# Patient Record
Sex: Female | Born: 1955 | Race: White | Hispanic: No | Smoking: Former smoker
Health system: Southern US, Community
[De-identification: ages and names within clinical notes are randomized; demographics above are authoritative.]

---

## 2008-12-17 HISTORY — PX: COLONOSCOPY: SHX174

## 2008-12-17 LAB — HM COLONOSCOPY

## 2014-10-15 LAB — HM MAMMOGRAPHY: HM Mammogram: NORMAL

## 2014-12-17 HISTORY — PX: COLONOSCOPY: SHX174

## 2015-01-01 ENCOUNTER — Ambulatory Visit: Payer: Self-pay | Admitting: Family Medicine

## 2015-01-01 LAB — COMPREHENSIVE METABOLIC PANEL
AST: 14 U/L — AB (ref 15–37)
Albumin: 3.9 g/dL (ref 3.4–5.0)
Alkaline Phosphatase: 69 U/L
Anion Gap: 12 (ref 7–16)
BUN: 11 mg/dL (ref 7–18)
Bilirubin,Total: 0.5 mg/dL (ref 0.2–1.0)
CHLORIDE: 105 mmol/L (ref 98–107)
CREATININE: 0.68 mg/dL (ref 0.60–1.30)
Calcium, Total: 8.9 mg/dL (ref 8.5–10.1)
Co2: 26 mmol/L (ref 21–32)
EGFR (African American): >60
Glucose: 113 mg/dL — ABNORMAL HIGH (ref 65–99)
OSMOLALITY: 285 (ref 275–301)
POTASSIUM: 3.8 mmol/L (ref 3.5–5.1)
SGPT (ALT): 23 U/L
SODIUM: 143 mmol/L (ref 136–145)
Total Protein: 7 g/dL (ref 6.4–8.2)

## 2015-01-01 LAB — CBC WITH DIFFERENTIAL/PLATELET
Basophil #: 0 10*3/uL (ref 0.0–0.1)
Basophil %: 0.6 %
EOS PCT: 1.1 %
Eosinophil #: 0.1 10*3/uL (ref 0.0–0.7)
HCT: 39.1 % (ref 35.0–47.0)
HGB: 12.9 g/dL (ref 12.0–16.0)
Lymphocyte #: 1.4 10*3/uL (ref 1.0–3.6)
Lymphocyte %: 20.8 %
MCH: 30.7 pg (ref 26.0–34.0)
MCHC: 33 g/dL (ref 32.0–36.0)
MCV: 93 fL (ref 80–100)
MONOS PCT: 5.9 %
Monocyte #: 0.4 x10 3/mm (ref 0.2–0.9)
Neutrophil #: 4.9 10*3/uL (ref 1.4–6.5)
Neutrophil %: 71.6 %
Platelet: 201 10*3/uL (ref 150–440)
RBC: 4.21 10*6/uL (ref 3.80–5.20)
RDW: 12.9 % (ref 11.5–14.5)
WBC: 6.9 10*3/uL (ref 3.6–11.0)

## 2015-01-01 LAB — URINALYSIS, COMPLETE
Bacteria: NEGATIVE
Bilirubin,UR: NEGATIVE
Glucose,UR: NEGATIVE
KETONE: NEGATIVE
Leukocyte Esterase: NEGATIVE
Nitrite: NEGATIVE
PROTEIN: NEGATIVE
Ph: 6 (ref 5.0–8.0)
Specific Gravity: 1.01 (ref 1.000–1.030)

## 2015-01-03 LAB — URINE CULTURE

## 2015-06-24 ENCOUNTER — Ambulatory Visit (INDEPENDENT_AMBULATORY_CARE_PROVIDER_SITE_OTHER): Payer: 59 | Admitting: Internal Medicine

## 2015-06-24 ENCOUNTER — Encounter: Payer: Self-pay | Admitting: Internal Medicine

## 2015-06-24 ENCOUNTER — Other Ambulatory Visit: Payer: Self-pay

## 2015-06-24 VITALS — BP 100/56 | HR 68 | Ht 63.0 in | Wt 122.2 lb

## 2015-06-24 DIAGNOSIS — K5730 Diverticulosis of large intestine without perforation or abscess without bleeding: Secondary | ICD-10-CM

## 2015-06-24 DIAGNOSIS — M779 Enthesopathy, unspecified: Secondary | ICD-10-CM

## 2015-06-24 DIAGNOSIS — K219 Gastro-esophageal reflux disease without esophagitis: Secondary | ICD-10-CM | POA: Diagnosis not present

## 2015-06-24 DIAGNOSIS — R058 Other specified cough: Secondary | ICD-10-CM

## 2015-06-24 DIAGNOSIS — G43909 Migraine, unspecified, not intractable, without status migrainosus: Secondary | ICD-10-CM | POA: Insufficient documentation

## 2015-06-24 DIAGNOSIS — R05 Cough: Secondary | ICD-10-CM | POA: Insufficient documentation

## 2015-06-24 DIAGNOSIS — H6980 Other specified disorders of Eustachian tube, unspecified ear: Secondary | ICD-10-CM | POA: Insufficient documentation

## 2015-06-24 HISTORY — DX: Other specified cough: R05.8

## 2015-06-24 HISTORY — DX: Enthesopathy, unspecified: M77.9

## 2015-06-24 NOTE — Progress Notes (Signed)
Date:  06/24/2015   Name:  Lisa Blackburn   DOB:  04-24-1956   MRN:  [id number]   Chief Complaint: Diarrhea Diarrhea  This is a new (has some abd bloating last night the several forms stools.  Now feels better.) problem. The current episode started yesterday. Pertinent negatives include no abdominal pain, chills, coughing, fever or vomiting. Nothing aggravates the symptoms. There are no known risk factors.  Lisa Blackburn has a history of diverticulitis - last episode in January.  Lisa Blackburn mother was concerned and did not want Lisa Blackburn to get as sick as Lisa Blackburn was before.  Lisa Blackburn was scheduled for an EGD and Colonoscopy but had to cancel due to insurance authorization.  Lisa Blackburn will do this later this summer.   Review of Systems:  Review of Systems  Constitutional: Negative for fever, chills and unexpected weight change.  Respiratory: Negative for cough and shortness of breath.   Cardiovascular: Negative for chest pain.  Gastrointestinal: Positive for diarrhea. Negative for vomiting, abdominal pain and blood in stool.  Neurological: Negative for weakness.    Patient Active Problem List   Diagnosis Date Noted  . Headache, migraine 06/24/2015  . Acute calcific periarthritis 06/24/2015  . Allergic cough 06/24/2015  . Diverticulosis of sigmoid colon 06/24/2015  . Dysfunction of eustachian tube 06/24/2015  . Gastro-esophageal reflux disease without esophagitis 06/24/2015    Prior to Admission medications   Medication Sig Start Date End Date Taking? Authorizing Provider  estradiol (ESTRACE) 0.5 MG tablet Take 0.5 mg by mouth daily.   Yes Historical Provider, MD  progesterone (PROMETRIUM) 100 MG capsule Take by mouth.   Yes Historical Provider, MD    Allergies  Allergen Reactions  . Codeine Nausea Only    No past surgical history on file.  History  Substance Use Topics  . Smoking status: Never Smoker   . Smokeless tobacco: Not on file  . Alcohol Use: 0.0 oz/week    0 Standard drinks or equivalent per week      Medication list has been reviewed and updated.  Physical Examination:  Physical Exam  Constitutional: Lisa Blackburn is oriented to person, place, and time. Lisa Blackburn appears well-developed and well-nourished. No distress.  Neck: Neck supple. No thyromegaly present.  Cardiovascular: Normal rate, regular rhythm and normal heart sounds.   Pulmonary/Chest: Breath sounds normal. Lisa Blackburn has no wheezes.  Abdominal: Soft. Bowel sounds are normal. Lisa Blackburn exhibits no distension and no mass. There is no tenderness. There is no rebound and no guarding.  Lymphadenopathy:    Lisa Blackburn has no cervical adenopathy.  Neurological: Lisa Blackburn is alert and oriented to person, place, and time.  Skin: Skin is warm and dry.    BP 100/56 mmHg  Pulse 68  Ht 5\' 3"  (1.6 m)  Wt 122 lb 3.2 oz (55.43 kg)  BMI 21.65 kg/m2  Assessment and Plan: 1. Diverticulosis of sigmoid colon Proceed with EGD and Colon as planned No evidence of infection at this time  2. Gastro-esophageal reflux disease without esophagitis Improved - no longer using PPI regularly   Bari EdwardLaura Berglund, MD [facility]  06/24/2015

## 2015-07-11 ENCOUNTER — Ambulatory Visit (INDEPENDENT_AMBULATORY_CARE_PROVIDER_SITE_OTHER): Payer: 59 | Admitting: Internal Medicine

## 2015-07-11 ENCOUNTER — Encounter: Payer: Self-pay | Admitting: Internal Medicine

## 2015-07-11 VITALS — BP 96/56 | HR 60 | Ht 63.0 in | Wt 124.2 lb

## 2015-07-11 DIAGNOSIS — M659 Synovitis and tenosynovitis, unspecified: Secondary | ICD-10-CM | POA: Diagnosis not present

## 2015-07-11 DIAGNOSIS — M778 Other enthesopathies, not elsewhere classified: Secondary | ICD-10-CM

## 2015-07-11 MED ORDER — PREDNISONE 10 MG (21) PO TBPK: 10.0000 mg | ORAL_TABLET | Freq: Every day | ORAL | Status: DC

## 2015-07-11 NOTE — Progress Notes (Signed)
Date:  07/11/2015   Name:  Lisa Blackburn   DOB:  1956/12/01   MRN:  [id number]   Chief Complaint: Joint Swelling Arm Pain  The incident occurred 12 to 24 hours ago. The incident occurred at home. There was no injury mechanism. The pain is present in the right elbow. The quality of the pain is described as aching. The pain does not radiate. The pain is mild. The pain has been constant since the incident. Pertinent negatives include no muscle weakness, numbness or tingling. Nothing aggravates the symptoms. She has tried ice for the symptoms. The treatment provided mild relief.   Review of Systems:  Review of Systems  Constitutional: Negative for fever and chills.  Musculoskeletal: Positive for joint swelling and arthralgias.  Neurological: Negative for tingling, weakness and numbness.    Patient Active Problem List   Diagnosis Date Noted  . Headache, migraine 06/24/2015  . Acute calcific periarthritis 06/24/2015  . Allergic cough 06/24/2015  . Diverticulosis of sigmoid colon 06/24/2015  . Dysfunction of eustachian tube 06/24/2015  . Gastro-esophageal reflux disease without esophagitis 06/24/2015    Prior to Admission medications   Medication Sig Start Date End Date Taking? Authorizing Provider  estradiol (ESTRACE) 0.5 MG tablet Take 0.5 mg by mouth daily.   Yes Historical Provider, MD  progesterone (PROMETRIUM) 100 MG capsule Take by mouth.   Yes Historical Provider, MD    Allergies  Allergen Reactions  . Codeine Nausea Only    No past surgical history on file.  History  Substance Use Topics  . Smoking status: Never Smoker   . Smokeless tobacco: Not on file  . Alcohol Use: 0.0 oz/week    0 Standard drinks or equivalent per week     Medication list has been reviewed and updated.  Physical Examination:  Physical Exam  Constitutional: She is oriented to person, place, and time. She appears well-developed and well-nourished. No distress.  Cardiovascular: Normal  rate.   Pulses:      Radial pulses are 2+ on the right side, and 2+ on the left side.  Pulmonary/Chest: Effort normal and breath sounds normal.  Musculoskeletal:       Right elbow: She exhibits swelling. Tenderness found. Lateral epicondyle and olecranon process tenderness noted.       Arms: Neurological: She is alert and oriented to person, place, and time.  Nursing note and vitals reviewed.   BP 96/56 mmHg  Pulse 60  Ht  (1.6 m)  Wt 124 lb 3.2 oz (56.337 kg)  BMI 22.01 kg/m2  Assessment and Plan: 1. Tendonitis of elbow, right With some bursitis - improving with ice  Continue ice as needed - predniSONE (STERAPRED UNI-PAK 21 TAB) 10 MG (21) TBPK tablet; Take 1 tablet (10 mg total) by mouth daily.  Dispense: 21 tablet; Refill: 0  Bari Edward, MD [facility]  07/11/2015

## 2015-08-18 IMAGING — CT CT ABD-PELV W/ CM
2 of 5 series · 16 of 46 positions shown, 18 images · IV contrast (isovue)
Comparison: None.

CLINICAL DATA: One day history of right lower quadrant abdominal
pain.

EXAM:
CT ABDOMEN AND PELVIS WITH CONTRAST
TECHNIQUE: Multidetector CT imaging of the abdomen and pelvis was performed
using the standard protocol following bolus administration of
intravenous contrast.
CONTRAST:  100 ml Isovue 370 IV. Oral contrast was also
administered.

[Series 2: axial soft tissue · axial · 0.65mm/px · z∈[-826,-456]mm · 13 of 84 slices shown, 15 images]
[im 5/84  soft-tissue]
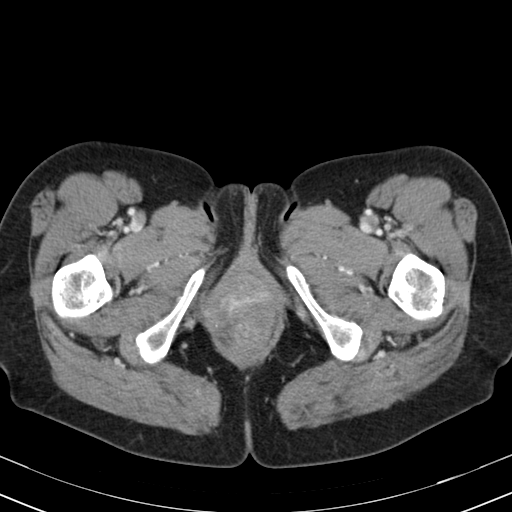
[im 5/84  bone]
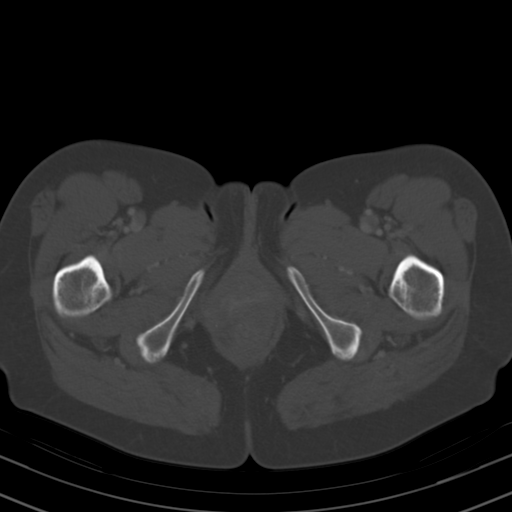
[im 14/84  soft-tissue]
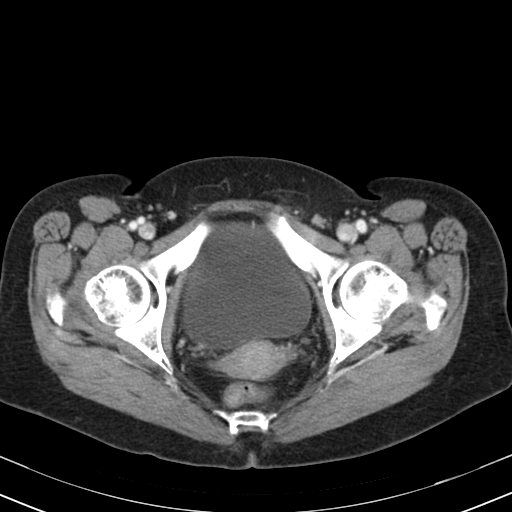
[im 18/84  soft-tissue]
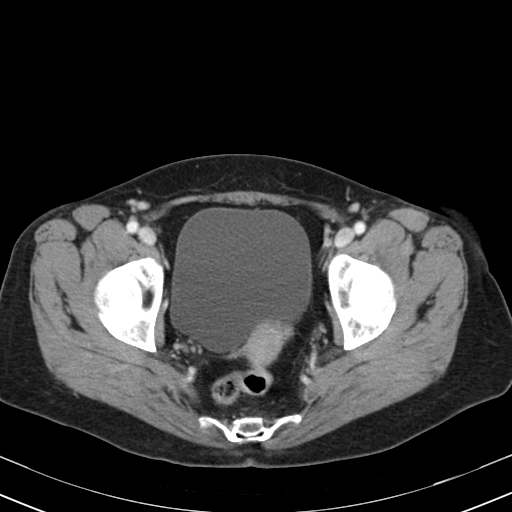
[im 22/84  soft-tissue]
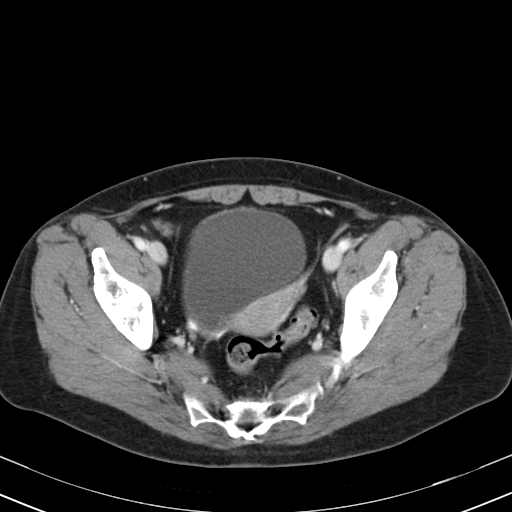
[im 31/84  soft-tissue]
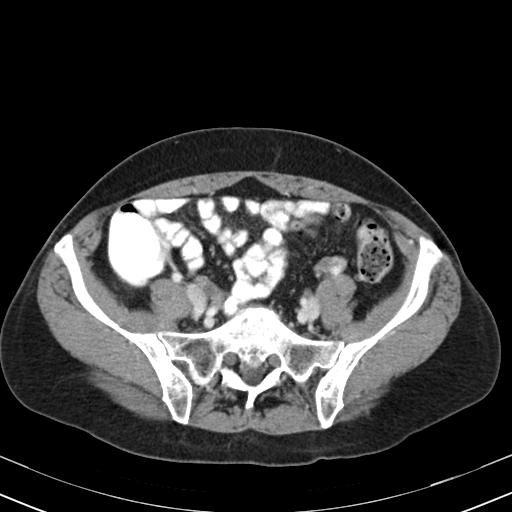
[im 35/84  soft-tissue]
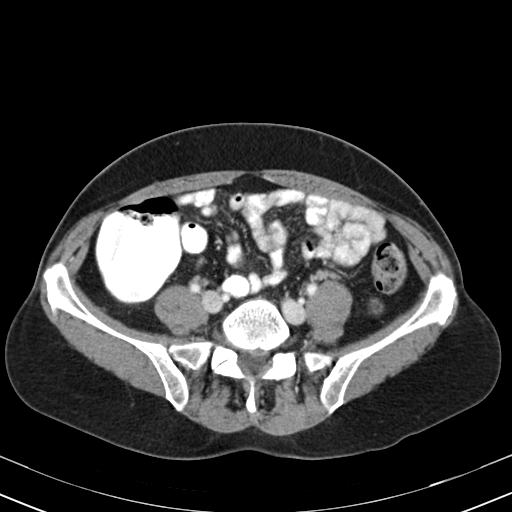
[im 44/84  soft-tissue]
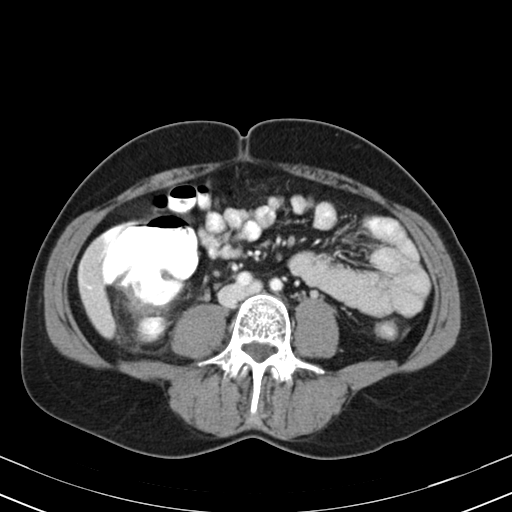
[im 49/84  soft-tissue]
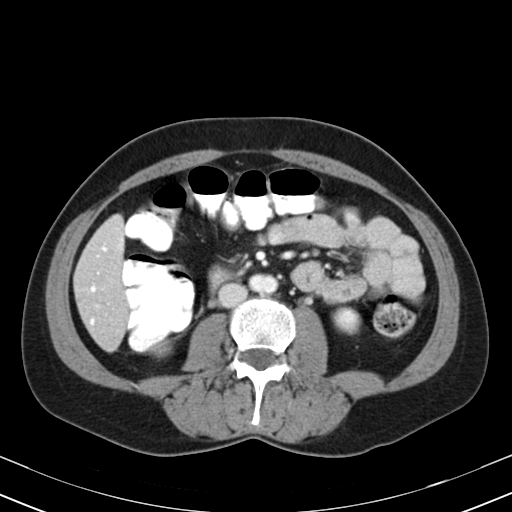
[im 53/84  soft-tissue]
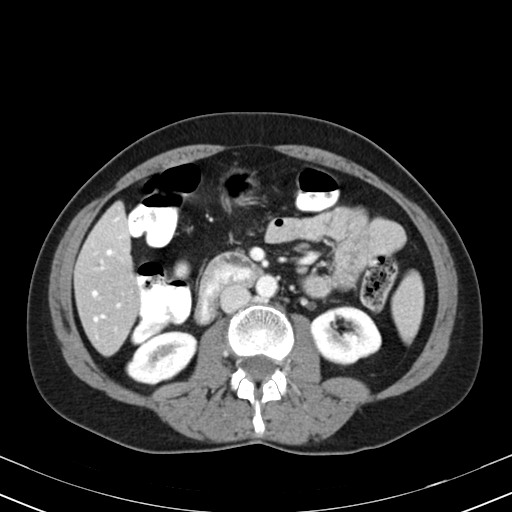
[im 53/84  bone]
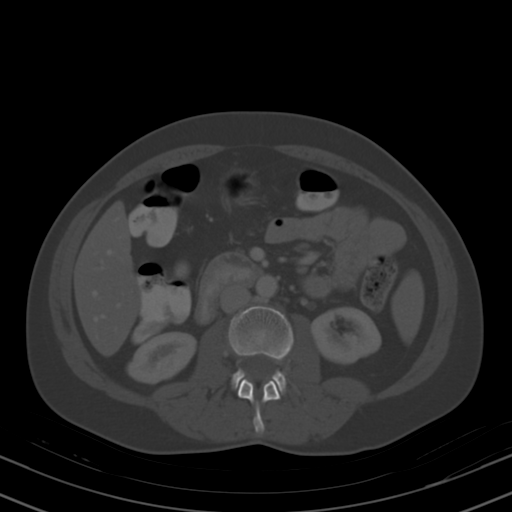
[im 62/84  soft-tissue]
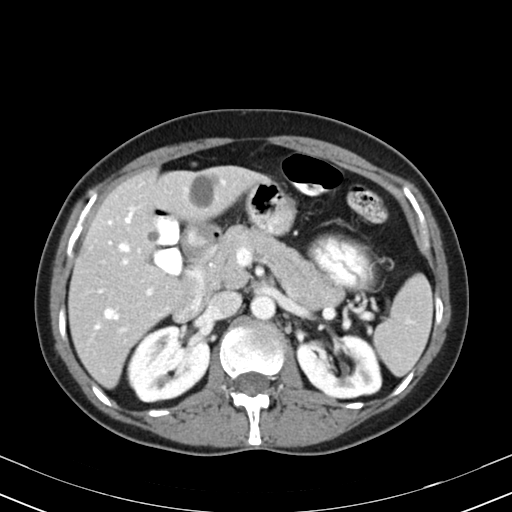
[im 66/84  soft-tissue]
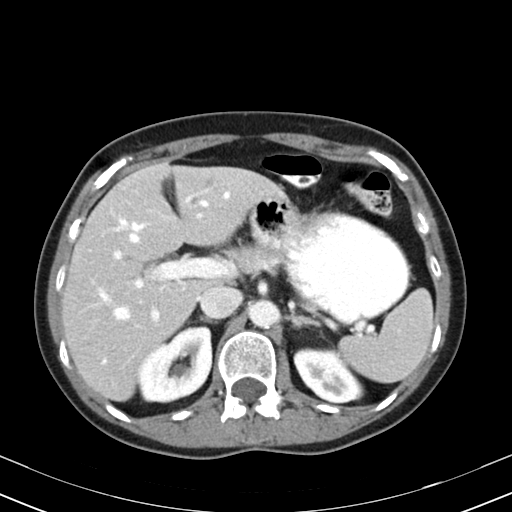
[im 70/84  soft-tissue]
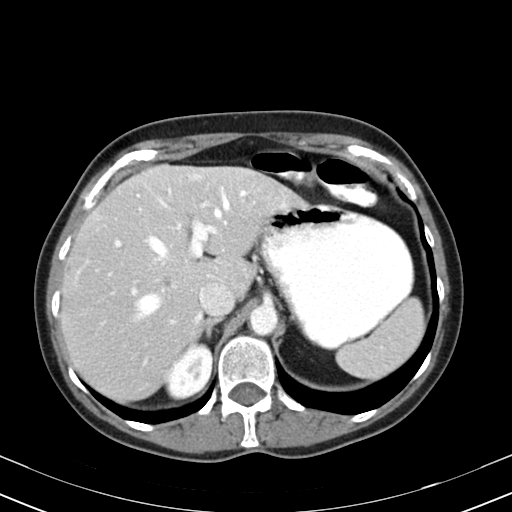
[im 79/84  soft-tissue]
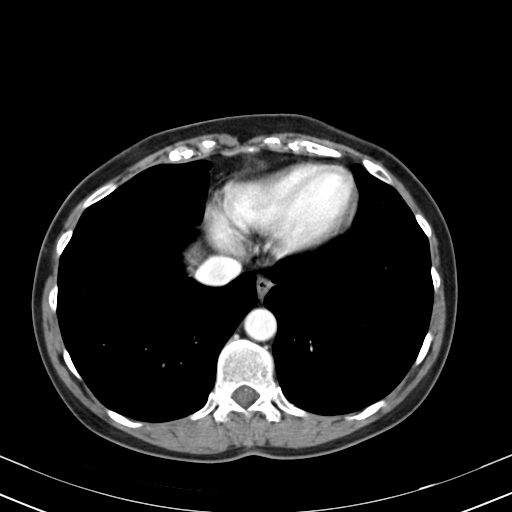

[Series 603: coronal · coronal · 0.81mm/px · 3 of 74 slices shown]
[im 25/74  soft-tissue]
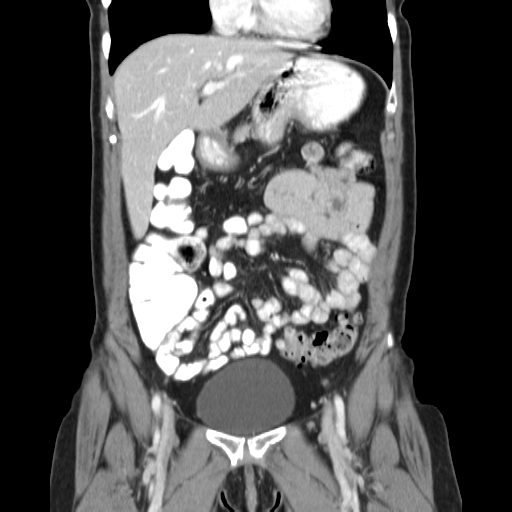
[im 33/74  soft-tissue]
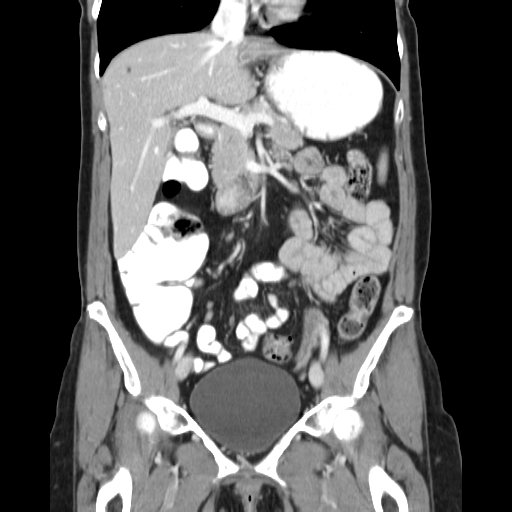
[im 41/74  soft-tissue]
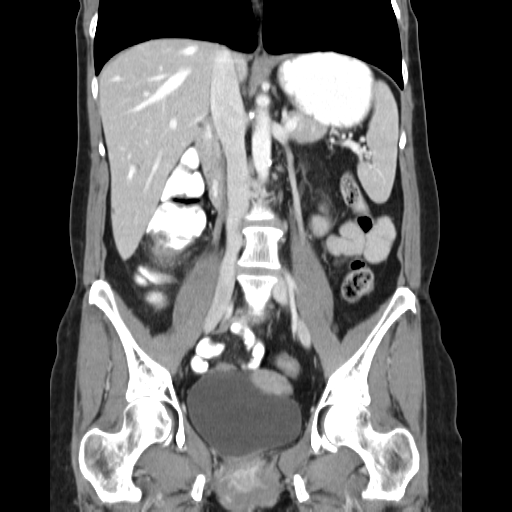

[16 of 46 positions shown; findings below may reference images not displayed]

FINDINGS: Normal-appearing appendix in the right upper pelvis, partially
filled with oral contrast material. Focal edema/inflammation in the
fat adjacent to the mid ascending colon, approximately 5 cm above
the origin of the appendix. There is a solitary diverticulum in this
region, best seen on the coronal images (image 42). No extraluminal
gas or abnormal fluid collection. More extensive diverticulosis
involving the distal descending and sigmoid colon without evidence
of acute diverticulitis. Remainder of the colon normal in
appearance.

Normal appearing stomach filled with oral contrast. Normal-appearing
small bowel. No ascites.

Numerous cysts throughout both lobes of the liver, the largest
approximating 2.7 cm in the lateral segment left lobe. No
significant abnormality involving the liver. Normal appearing
spleen, pancreas, and adrenal glands. Persistent fetal lobulations
accounting for the irregular contour in both kidneys, subcentimeter
cortical cysts in both kidneys, and a nonobstructing approximate 3
mm calculus in an upper pole calix of the left kidney. Moderate
aortoiliac atherosclerosis. No significant lymphadenopathy.

Urinary bladder normal in appearance. Uterus mildly atrophic
consistent with age. Ovaries normal in appearance. No adnexal masses
or free pelvic fluid.

Bone window images demonstrate mild degenerative disc disease and
spondylosis at L3-4. Visualized lung bases clear. Heart size normal.
IMPRESSION: 1. Acute diverticulitis involving the mid ascending colon. No
evidence of perforation or abscess.
2. Extensive diverticulosis involving the distal descending and
sigmoid colon without evidence of acute diverticulitis.
3. Numerous hepatic cysts. No significant abnormalities involving
the liver.
4. Nonobstructing 3 mm calculus in an upper pole calix of the left
kidney.

## 2015-08-19 DIAGNOSIS — Z86010 Personal history of colonic polyps: Secondary | ICD-10-CM | POA: Insufficient documentation

## 2015-10-13 ENCOUNTER — Ambulatory Visit (INDEPENDENT_AMBULATORY_CARE_PROVIDER_SITE_OTHER): Payer: 59 | Admitting: Internal Medicine

## 2015-10-13 ENCOUNTER — Encounter: Payer: Self-pay | Admitting: Internal Medicine

## 2015-10-13 VITALS — BP 102/60 | HR 72 | Ht 63.0 in | Wt 129.8 lb

## 2015-10-13 DIAGNOSIS — Z23 Encounter for immunization: Secondary | ICD-10-CM | POA: Diagnosis not present

## 2015-10-13 DIAGNOSIS — L259 Unspecified contact dermatitis, unspecified cause: Secondary | ICD-10-CM | POA: Diagnosis not present

## 2015-10-13 MED ORDER — TRIAMCINOLONE ACETONIDE 0.1 % EX CREA: 1.0000 "application " | TOPICAL_CREAM | Freq: Two times a day (BID) | CUTANEOUS | Status: DC

## 2015-10-13 NOTE — Progress Notes (Signed)
Date:  10/13/2015   Name:  Lisa PullingSarah Lloyd Howson   DOB:  12-08-1956   MRN:  [id number]   Chief Complaint: Rash  Patient complains of a rash on her left upper arm. It's been there for the past 3-4 weeks. She does not know of any inciting event. She cannot recall any new exposures to plants or animals. She describes small flesh-colored macules that become extremely pruritic. She used topical Benadryl, tar soap, tea tree oil, and Lamisil. None of these treatments have provided any benefit.   Review of Systems  Constitutional: Negative for fever and chills.  HENT: Negative for facial swelling.   Respiratory: Negative for shortness of breath.   Cardiovascular: Negative for chest pain.  Musculoskeletal: Negative for joint swelling and arthralgias.  Skin: Positive for rash. Negative for color change.    Patient Active Problem List   Diagnosis Date Noted  . H/O adenomatous polyp of colon 08/19/2015  . Headache, migraine 06/24/2015  . Acute calcific periarthritis 06/24/2015  . Allergic cough 06/24/2015  . Diverticulosis of sigmoid colon 06/24/2015  . Dysfunction of eustachian tube 06/24/2015  . Gastro-esophageal reflux disease without esophagitis 06/24/2015    Prior to Admission medications   Medication Sig Start Date End Date Taking? Authorizing Provider  estradiol (ESTRACE) 0.5 MG tablet Take 0.5 mg by mouth daily.   Yes Historical Provider, MD  progesterone (PROMETRIUM) 100 MG capsule Take by mouth.   Yes Historical Provider, MD    Allergies  Allergen Reactions  . Codeine Nausea Only    Past Surgical History  Procedure Laterality Date  . Colonoscopy  2010    polyps - repeat 2015  . Colonoscopy  2016    Social History  Substance Use Topics  . Smoking status: Never Smoker   . Smokeless tobacco: None  . Alcohol Use: 0.0 oz/week    0 Standard drinks or equivalent per week     Medication list has been reviewed and updated.   Physical Exam  Constitutional: She is oriented  to person, place, and time. She appears well-developed. No distress.  HENT:  Head: Normocephalic and atraumatic.  Eyes: Conjunctivae are normal. Right eye exhibits no discharge. Left eye exhibits no discharge. No scleral icterus.  Pulmonary/Chest: Effort normal. No respiratory distress.  Musculoskeletal: Normal range of motion.  Neurological: She is alert and oriented to person, place, and time.  Skin: Skin is warm and dry. No rash noted.  Left upper outer arm slightly pink in color in general Area of excoriation just above the elbow where there were previous macules - no evidence of drainage and/or bacterial infection. Several small flesh-colored papules are noted - benign in appearance  Psychiatric: She has a normal mood and affect. Her behavior is normal. Thought content normal.    BP 102/60 mmHg  Pulse 72  Ht 5\' 3"  (1.6 m)  Wt 129 lb 12.8 oz (58.877 kg)  BMI 23.00 kg/m2  Assessment and Plan: 1. Contact dermatitis Non specific dermatitis vs chronic solar damage Consult dermatology if persistent - triamcinolone cream (KENALOG) 0.1 %; Apply 1 application topically 2 (two) times daily.  Dispense: 30 g; Refill: 0  2. Flu vaccine need - Flu Vaccine QUAD 36+ mos PF IM (Fluarix & Fluzone Quad PF)   Bari EdwardLaura Tamiah Dysart, MD [facility]  10/13/2015

## 2015-11-22 ENCOUNTER — Ambulatory Visit (INDEPENDENT_AMBULATORY_CARE_PROVIDER_SITE_OTHER): Payer: 59 | Admitting: Internal Medicine

## 2015-11-22 ENCOUNTER — Encounter: Payer: Self-pay | Admitting: Internal Medicine

## 2015-11-22 VITALS — BP 100/60 | HR 72 | Ht 63.0 in | Wt 130.4 lb

## 2015-11-22 DIAGNOSIS — M5432 Sciatica, left side: Secondary | ICD-10-CM | POA: Diagnosis not present

## 2015-11-22 DIAGNOSIS — N760 Acute vaginitis: Secondary | ICD-10-CM | POA: Diagnosis not present

## 2015-11-22 DIAGNOSIS — R300 Dysuria: Secondary | ICD-10-CM | POA: Diagnosis not present

## 2015-11-22 DIAGNOSIS — B9689 Other specified bacterial agents as the cause of diseases classified elsewhere: Secondary | ICD-10-CM

## 2015-11-22 DIAGNOSIS — A499 Bacterial infection, unspecified: Secondary | ICD-10-CM | POA: Diagnosis not present

## 2015-11-22 LAB — POC URINALYSIS WITH MICROSCOPIC (NON AUTO)MANUAL RESULT
BILIRUBIN UA: NEGATIVE
Bacteria, UA: 0
CRYSTALS: 0
GLUCOSE UA: NEGATIVE
Ketones, UA: NEGATIVE
Mucus, UA: 0
Nitrite, UA: NEGATIVE
PH UA: 5
Protein, UA: NEGATIVE
RBC: 0 M/uL — AB (ref 4.04–5.48)
SPEC GRAV UA: 1.02
Urobilinogen, UA: 0.2
WBC CASTS UA: 0

## 2015-11-22 LAB — POCT WET PREP WITH KOH
KOH PREP POC: NEGATIVE
RBC WET PREP PER HPF POC: 0
Trichomonas, UA: NEGATIVE
WBC Wet Prep HPF POC: 0
Yeast Wet Prep HPF POC: NEGATIVE

## 2015-11-22 MED ORDER — METRONIDAZOLE 500 MG PO TABS: 500.0000 mg | ORAL_TABLET | Freq: Two times a day (BID) | ORAL | Status: DC

## 2015-11-22 MED ORDER — CYCLOBENZAPRINE HCL 10 MG PO TABS: 10.0000 mg | ORAL_TABLET | Freq: Three times a day (TID) | ORAL | Status: DC | PRN

## 2015-11-22 NOTE — Progress Notes (Signed)
Date:  11/22/2015   Name:  Lisa Blackburn   DOB:  1956-11-10   MRN:  [id number]   Chief Complaint: Urinary Tract Infection; Nasal Congestion; and Back Pain  Vaginitis Patient complains of onset of vaginal irritation and discharge along with burning with urination about a week ago. She denies any blood in her urine, fever chills or flank pain. She tried some over-the-counter Monistat without benefit. Sciatica - patient notes low back discomfort with left-sided discomfort in the buttock and posterior thigh. 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 She takes ibuprofen or Aleve but has not used a muscle relaxer heat or ice. Postnasal drip - Patient has intermittent sinus congestion and drainage.  She denies fever, facial pain or purulent nasal drainage.     Review of Systems  Constitutional: Negative for chills, diaphoresis and fatigue.  HENT: Positive for congestion, postnasal drip and sneezing. Negative for sinus pressure and sore throat.   Eyes: Negative for visual disturbance.  Respiratory: Negative for cough, chest tightness and shortness of breath.   Cardiovascular: Negative for chest pain.  Gastrointestinal: Negative for abdominal pain and blood in stool.  Genitourinary: Positive for dysuria and vaginal discharge. Negative for hematuria, vaginal bleeding and vaginal pain.  Musculoskeletal: Positive for back pain.  Neurological: Positive for numbness (left sided sciatica intermittently).    Patient Active Problem List   Diagnosis Date Noted  . H/O adenomatous polyp of colon 08/19/2015  . Headache, migraine 06/24/2015  . Acute calcific periarthritis 06/24/2015  . Allergic cough 06/24/2015  . Diverticulosis of sigmoid colon 06/24/2015  . Dysfunction of eustachian tube 06/24/2015  . Gastro-esophageal reflux disease without esophagitis 06/24/2015    Prior to Admission medications   Medication Sig Start Date End Date Taking? Authorizing Provider  estradiol (ESTRACE) 0.5 MG  tablet Take 0.5 mg by mouth daily.   Yes Historical Provider, MD  progesterone (PROMETRIUM) 100 MG capsule Take by mouth.   Yes Historical Provider, MD  triamcinolone cream (KENALOG) 0.1 % Apply 1 application topically 2 (two) times daily. 10/13/15  Yes Reubin Milan, MD  cyclobenzaprine (FLEXERIL) 10 MG tablet Take 1 tablet (10 mg total) by mouth 3 (three) times daily as needed for muscle spasms. 11/22/15   Reubin Milan, MD  metroNIDAZOLE (FLAGYL) 500 MG tablet Take 1 tablet (500 mg total) by mouth 2 (two) times daily. 11/22/15   Reubin Milan, MD    Allergies  Allergen Reactions  . Codeine Nausea Only    Past Surgical History  Procedure Laterality Date  . Colonoscopy  2010    polyps - repeat 2015  . Colonoscopy  2016    Social History  Substance Use Topics  . Smoking status: Never Smoker   . Smokeless tobacco: None  . Alcohol Use: 0.0 oz/week    0 Standard drinks or equivalent per week    Medication list has been reviewed and updated.   Physical Exam  Constitutional: She appears well-developed and well-nourished.  HENT:  Right Ear: Tympanic membrane and ear canal normal.  Left Ear: Tympanic membrane and ear canal normal.  Nose: Right sinus exhibits no maxillary sinus tenderness. Left sinus exhibits no maxillary sinus tenderness.  Mouth/Throat: No posterior oropharyngeal erythema.  Neck: Normal range of motion. Neck supple. No thyromegaly present.  Cardiovascular: Normal rate, regular rhythm and normal heart sounds.   Pulmonary/Chest: Breath sounds normal.  Abdominal: Soft. Normal appearance.  Genitourinary: There is no tenderness or lesion on the right labia. There is no tenderness or  lesion on the left labia. Cervix exhibits no motion tenderness. Right adnexum displays no mass and no tenderness. Left adnexum displays no mass and no tenderness. There is erythema and tenderness in the vagina. No bleeding in the vagina. Vaginal discharge found.  Musculoskeletal:        Lumbar back: She exhibits tenderness. She exhibits no edema, no pain and no spasm.  Lymphadenopathy:    She has no cervical adenopathy.  Neurological: She has normal reflexes.  SLR negative bilaterally  Nursing note and vitals reviewed.   BP 100/60 mmHg  Pulse 72  Ht 5\' 3"  (1.6 m)  Wt 130 lb 6.4 oz (59.149 kg)  BMI 23.11 kg/m2  Assessment and Plan: 1. Dysuria Urinalysis negative - POC urinalysis w microscopic (non auto)  2. BV (bacterial vaginosis) - metroNIDAZOLE (FLAGYL) 500 MG tablet; Take 1 tablet (500 mg total) by mouth 2 (two) times daily.  Dispense: 14 tablet; Refill: 0 - POCT Wet Prep with KOH  3. Sciatica associated with disorder of lumbar spine, left Continue Advil or Aleve as needed Also may try ice or heat at the end of a busy shift - cyclobenzaprine (FLEXERIL) 10 MG tablet; Take 1 tablet (10 mg total) by mouth 3 (three) times daily as needed for muscle spasms.  Dispense: 30 tablet; Refill: 0   Bari EdwardLaura Kento Gossman, MD [facility]  11/22/2015

## 2017-02-09 ENCOUNTER — Ambulatory Visit
Admission: EM | Admit: 2017-02-09 | Discharge: 2017-02-09 | Disposition: A | Discharge status: Home / Self Care | Payer: BLUE CROSS/BLUE SHIELD | Attending: Emergency Medicine | Admitting: Emergency Medicine

## 2017-02-09 ENCOUNTER — Ambulatory Visit (INDEPENDENT_AMBULATORY_CARE_PROVIDER_SITE_OTHER): Payer: BLUE CROSS/BLUE SHIELD

## 2017-02-09 ENCOUNTER — Encounter: Payer: Self-pay | Admitting: Gynecology

## 2017-02-09 DIAGNOSIS — W19XXXA Unspecified fall, initial encounter: Secondary | ICD-10-CM

## 2017-02-09 DIAGNOSIS — S0990XA Unspecified injury of head, initial encounter: Secondary | ICD-10-CM

## 2017-02-09 DIAGNOSIS — I6200 Nontraumatic subdural hemorrhage, unspecified: Secondary | ICD-10-CM

## 2017-02-09 DIAGNOSIS — S065XAA Traumatic subdural hemorrhage with loss of consciousness status unknown, initial encounter: Secondary | ICD-10-CM

## 2017-02-09 DIAGNOSIS — S065X9A Traumatic subdural hemorrhage with loss of consciousness of unspecified duration, initial encounter: Secondary | ICD-10-CM

## 2017-02-09 NOTE — ED Triage Notes (Signed)
Per patient loss balance and hit back of her head [address] at home. Patient present with knot on the back of her head and c/o headache.

## 2017-02-09 NOTE — Discharge Instructions (Signed)
1 g of Tylenol up to 4 times a day as needed for pain. No NSAID such as ibuprofen, Aleve, aspirin. Watch her for the next 24 hours. Go immediately to the ER for any change in mental status, trouble with coordination, trouble talking, walking, arm or leg weakness, worsening of numbness in her hands, feet or for other concerns.

## 2017-02-09 NOTE — ED Provider Notes (Signed)
HPI  SUBJECTIVE:  Lisa Blackburn is a 61 y.o. female who presents with a head injury with loss of consciousness. Patient states that she was walking backwards, tripped and fell backwards and hit the back of her head on the concrete. This occurred 2 hours prior to arrival . Her family member states that he found her standing, but patient was complaining of dizziness described as feeling off balance and numbness in her fingers and legs. States it is resolved in her legs, but is still present in her fingers. She reports a diffuse, throbbing, constant headache. She was ambulatory after the event. She has soft tissue swelling at the posterior superior part of her right occiput. She tried ice on this with some improvement in swelling. No aggravating factors. No nausea, vomiting, amnesia. She also reports neck pain at the base of her skull which is stabbing, constant, less intense but similar to her headache. She denies visual changes, photophobia, dysarthria, aphasia. No chest pain, palpitations, shortness of breath, presyncope, syncope causing her fall. She is not on any anticoagulants or antiplatelet medications. No history of concussion or previous head injury. No history of arrhythmia, coagulopathies, easy bruising, MI, syncope. PMD: Bari Edward, MD  History reviewed. No pertinent past medical history.  Past Surgical History:  Procedure Laterality Date  . COLONOSCOPY  2010   polyps - repeat 2015  . COLONOSCOPY  2016    Family History  Problem Relation Age of Onset  . Hypertension Mother   . Hyperlipidemia Mother   . Diabetes Mother   . Hypertension Father     Social History  Substance Use Topics  . Smoking status: Never Smoker  . Smokeless tobacco: Never Used  . Alcohol use 0.0 oz/week    No current facility-administered medications for this encounter.   Current Outpatient Prescriptions:  .  estradiol (ESTRACE) 0.5 MG tablet, Take 0.5 mg by mouth daily., Disp: , Rfl:  .   progesterone (PROMETRIUM) 100 MG capsule, Take by mouth., Disp: , Rfl:   Allergies  Allergen Reactions  . Codeine Nausea Only     ROS  As noted in HPI.   Physical Exam  BP 136/72 (BP Location: Right Arm)   Pulse 76   Temp 98.4 F (36.9 C) (Oral)   Resp 16   Ht 5\' 3"  (1.6 m)   Wt 129 lb (58.5 kg)   SpO2 99%   BMI 22.85 kg/m   Constitutional: Well developed, well nourished, no acute distress Eyes:  PERRLA, no photophobia, EOMI, conjunctiva normal bilaterally HENT: Normocephalic,mucus membranes moist.  positive soft tissue swelling superior posterior aspect of the right scalp. No scalp laceration. No palpable skull fracture. No hemotympanum. No otorrhea. Respiratory: Normal inspiratory effort Cardiovascular: Normal rate GI: nondistended skin: No rash, skin intact Musculoskeletal: Tenderness at the base of skull but otherwise No C-spine, T-spine, L-spine tenderness. No trapezial tenderness. Patient able to actively rotate head 45 to the right and left  Neurologic: GCS 15. Alert & oriented x 3, cranial nerves II through XII intact, Romberg negative, tandem gait steady, finger-nose, heel shin within normal limits. Moving all extremities equally. No other focal neurologic deficits. Psychiatric: Speech and behavior appropriate   ED Course   Medications - No data to display  Orders Placed This Encounter  Procedures  . CT HEAD WO CONTRAST    Standing Status:   Standing    Number of Occurrences:   1    Order Specific Question:   Symptom/Reason for Exam  Answer:   Fall [[id number]]  . CT Cervical Spine Wo Contrast    Standing Status:   Standing    Number of Occurrences:   1    Order Specific Question:   Reason for Exam (SYMPTOM  OR DIAGNOSIS REQUIRED)    Answer:   fall, subjective numbness in hands, feet    Order Specific Question:   Is patient pregnant?    Answer:   No    No results found for this or any previous visit (from the past 24 hour(s)). Ct Head Wo  Contrast  Result Date: 02/09/2017 CLINICAL DATA:  61 year old female with fall and head and neck injury today. Initial encounter. EXAM: CT HEAD WITHOUT CONTRAST CT CERVICAL SPINE WITHOUT CONTRAST TECHNIQUE: Multidetector CT imaging of the head and cervical spine was performed following the standard protocol without intravenous contrast. Multiplanar CT image reconstructions of the cervical spine were also generated. COMPARISON:  None. FINDINGS: CT HEAD FINDINGS Brain: A 4 mm left parafalcine subdural hematoma is noted. No other extra-axial collection or hemorrhage identified. No midline shift, hydrocephalus or acute infarction identified. Vascular: No hyperdense vessel or unexpected calcification. Skull: Normal. Negative for fracture or focal lesion. Sinuses/Orbits: No acute finding. Other: High posterior scalp soft tissue swelling/ hematoma identified. CT CERVICAL SPINE FINDINGS Alignment: Loss of the normal cervical lordosis identified. No subluxation noted. Skull base and vertebrae: No acute fracture or focal bony lesion. Soft tissues and spinal canal: No prevertebral soft tissue swelling or visible canal hematoma. Disc levels: Moderate degenerative disc disease, spondylosis and broad-based disc bulge at C4-5 and mild to moderate degenerative disc disease and spondylosis is C5-6 noted, contributing to mild moderate central spinal and biforaminal narrowing at both levels. Upper chest: Negative. Other: A 1.6 cm nodule extending off of the posterior lower right thyroid identified. IMPRESSION: 4 mm left parafalcine subdural hematoma. No other acute intracranial abnormality. High posterior scalp soft tissue swelling/ hematoma without fracture. No static evidence of acute injury to the cervical spine. Degenerative changes as described. 1.6 cm right thyroid nodule. Consider further evaluation with thyroid ultrasound. If patient is clinically hyperthyroid, consider nuclear medicine thyroid uptake and scan. Critical  Value/emergent results were called by telephone at the time of interpretation on 02/09/2017 at 3:59 pm to Dr. Domenick GongASHLEY Rowan Pollman , who verbally acknowledged these results. Electronically Signed   By: Harmon PierJeffrey  Hu M.D.   On: 02/09/2017 16:00   Ct Cervical Spine Wo Contrast  Result Date: 02/09/2017 CLINICAL DATA:  61 year old female with fall and head and neck injury today. Initial encounter. EXAM: CT HEAD WITHOUT CONTRAST CT CERVICAL SPINE WITHOUT CONTRAST TECHNIQUE: Multidetector CT imaging of the head and cervical spine was performed following the standard protocol without intravenous contrast. Multiplanar CT image reconstructions of the cervical spine were also generated. COMPARISON:  None. FINDINGS: CT HEAD FINDINGS Brain: A 4 mm left parafalcine subdural hematoma is noted. No other extra-axial collection or hemorrhage identified. No midline shift, hydrocephalus or acute infarction identified. Vascular: No hyperdense vessel or unexpected calcification. Skull: Normal. Negative for fracture or focal lesion. Sinuses/Orbits: No acute finding. Other: High posterior scalp soft tissue swelling/ hematoma identified. CT CERVICAL SPINE FINDINGS Alignment: Loss of the normal cervical lordosis identified. No subluxation noted. Skull base and vertebrae: No acute fracture or focal bony lesion. Soft tissues and spinal canal: No prevertebral soft tissue swelling or visible canal hematoma. Disc levels: Moderate degenerative disc disease, spondylosis and broad-based disc bulge at C4-5 and mild to moderate degenerative disc disease and spondylosis is C5-6  noted, contributing to mild moderate central spinal and biforaminal narrowing at both levels. Upper chest: Negative. Other: A 1.6 cm nodule extending off of the posterior lower right thyroid identified. IMPRESSION: 4 mm left parafalcine subdural hematoma. No other acute intracranial abnormality. High posterior scalp soft tissue swelling/ hematoma without fracture. No static  evidence of acute injury to the cervical spine. Degenerative changes as described. 1.6 cm right thyroid nodule. Consider further evaluation with thyroid ultrasound. If patient is clinically hyperthyroid, consider nuclear medicine thyroid uptake and scan. Critical Value/emergent results were called by telephone at the time of interpretation on 02/09/2017 at 3:59 pm to Dr. Domenick Gong , who verbally acknowledged these results. Electronically Signed   By: Harmon Pier M.D.   On: 02/09/2017 16:00    ED Clinical Impression  Subdural hematoma (HCC)  Fall - Plan: CT HEAD WO CONTRAST, CT HEAD WO CONTRAST  Injury of head, initial encounter  Fall, initial encounter   ED Assessment/Plan  We'll CT head and C-spine given the history of loss of consciousness and the subjective paresthesias in her extremities. She has no objective neurologic findings on exam.  1600- Discussed case with rads- pt with 4 mm left parafalcine subdural hematoma. No other skull injury, C-spine injury. Also patient has a 1.6 cm right thyroid nodule. 1884- paged Neurosurgery  1615- Discussed case with Dr. Bevely Palmer, neurosurgery on call. If patient has reliable family at home, she can go home to be observed. She does not need to be woken up every hour. She can sleep as needed. Family is to watch for any acute deterioration or changes over the next 24 hours. This occurs, she needs to go immediately to the ED. She does not need neurosurgical follow-up unless things get worse. Home with Tylenol, no NSAIDs. Also discussed with her the right thyroid nodule and discussed that this will need to be worked up at some point.  giving patient copy of her films.   She does have a reliable family member.  Discussed imaging, MDM, plan and followup with patient and family. Discussed sn/sx that should prompt return to the ED. Patient  agrees with plan.   No orders of the defined types were placed in this encounter.   *This clinic note was  created using Dragon dictation software. Therefore, there may be occasional mistakes despite careful proofreading.  ?   Domenick Gong, MD 02/09/17 [phone]

## 2017-02-10 ENCOUNTER — Encounter: Payer: Self-pay | Admitting: Internal Medicine

## 2017-02-10 DIAGNOSIS — E042 Nontoxic multinodular goiter: Secondary | ICD-10-CM | POA: Insufficient documentation

## 2017-02-10 DIAGNOSIS — S065XAA Traumatic subdural hemorrhage with loss of consciousness status unknown, initial encounter: Secondary | ICD-10-CM | POA: Insufficient documentation

## 2017-02-10 DIAGNOSIS — Z87828 Personal history of other (healed) physical injury and trauma: Secondary | ICD-10-CM | POA: Insufficient documentation

## 2017-02-10 DIAGNOSIS — E041 Nontoxic single thyroid nodule: Secondary | ICD-10-CM | POA: Insufficient documentation

## 2017-02-10 DIAGNOSIS — S065X9A Traumatic subdural hemorrhage with loss of consciousness of unspecified duration, initial encounter: Secondary | ICD-10-CM | POA: Insufficient documentation

## 2017-02-13 ENCOUNTER — Encounter: Payer: Self-pay | Admitting: Internal Medicine

## 2017-02-13 ENCOUNTER — Ambulatory Visit (INDEPENDENT_AMBULATORY_CARE_PROVIDER_SITE_OTHER): Payer: BLUE CROSS/BLUE SHIELD | Admitting: Internal Medicine

## 2017-02-13 VITALS — BP 110/62 | HR 62 | Ht 63.0 in | Wt 128.0 lb

## 2017-02-13 DIAGNOSIS — I6200 Nontraumatic subdural hemorrhage, unspecified: Secondary | ICD-10-CM | POA: Diagnosis not present

## 2017-02-13 DIAGNOSIS — M5030 Other cervical disc degeneration, unspecified cervical region: Secondary | ICD-10-CM

## 2017-02-13 DIAGNOSIS — E041 Nontoxic single thyroid nodule: Secondary | ICD-10-CM | POA: Diagnosis not present

## 2017-02-13 DIAGNOSIS — S065XAA Traumatic subdural hemorrhage with loss of consciousness status unknown, initial encounter: Secondary | ICD-10-CM

## 2017-02-13 DIAGNOSIS — S065X9A Traumatic subdural hemorrhage with loss of consciousness of unspecified duration, initial encounter: Secondary | ICD-10-CM

## 2017-02-13 NOTE — Progress Notes (Signed)
Date:  02/13/2017   Name:  Lisa Blackburn   DOB:  1956/03/12   MRN:  [id number]   Chief Complaint: Fall (Follow up from urgent care. Feels like she gets tired then she did before fall. Headaches.) SDH - the patient fell 5 days ago at home striking the back of her head on concrete. She had a headache and was a little disoriented so went to urgent care. CT showed a 4 mm right subdural hematoma. Her neurologic exam was nonfocal so she was sent home with rest and close monitoring by family. Since then the headache has been persistent but gradually improving. She denies any double vision nausea vomiting weakness or other new symptoms. She has been trying to avoid work but still has been running errands etc.  Neck pain - CT of the neck was done at urgent care as well. This showed some mild to moderate degenerative disc disease at 2 levels. She continues to have some mild posterior neck discomfort but is not severe. It is relieved by Tylenol and heat.  Thyroid Nodule - in addition to the above findings, the CT also showed a 1.4 cm right thyroid nodule.   Review of Systems  Constitutional: Negative for diaphoresis, fatigue and fever.  Eyes: Negative for photophobia and visual disturbance.  Respiratory: Negative for chest tightness, shortness of breath and wheezing.   Cardiovascular: Negative for chest pain and palpitations.  Gastrointestinal: Negative for nausea and vomiting.  Musculoskeletal: Positive for arthralgias (neck).  Neurological: Positive for headaches (mild and improving). Negative for dizziness, tremors, syncope and numbness.    Patient Active Problem List   Diagnosis Date Noted  . Right thyroid nodule 02/10/2017  . SDH (subdural hematoma) (HCC) 02/10/2017  . H/O adenomatous polyp of colon 08/19/2015  . Headache, migraine 06/24/2015  . Acute calcific periarthritis 06/24/2015  . Allergic cough 06/24/2015  . Diverticulosis of sigmoid colon 06/24/2015  . Dysfunction of  eustachian tube 06/24/2015  . Gastro-esophageal reflux disease without esophagitis 06/24/2015    Prior to Admission medications   Medication Sig Start Date End Date Taking? Authorizing Provider  estradiol (ESTRACE) 0.5 MG tablet Take 0.5 mg by mouth daily.   Yes Historical Provider, MD  Multiple Vitamin (MULTIVITAMIN) capsule Take 1 capsule by mouth every morning.   Yes Historical Provider, MD  progesterone (PROMETRIUM) 100 MG capsule Take by mouth.   Yes Historical Provider, MD    Allergies  Allergen Reactions  . Codeine Nausea Only    Past Surgical History:  Procedure Laterality Date  . COLONOSCOPY  2010   polyps - repeat 2015  . COLONOSCOPY  2016    Social History  Substance Use Topics  . Smoking status: Never Smoker  . Smokeless tobacco: Never Used  . Alcohol use 0.0 oz/week     Medication list has been reviewed and updated.   Physical Exam  Constitutional: She is oriented to person, place, and time. She appears well-developed. No distress.  HENT:  Head: Normocephalic and atraumatic.  Eyes: Conjunctivae and EOM are normal. Pupils are equal, round, and reactive to light.  Neck: Normal range of motion and phonation normal. Neck supple. No tracheal tenderness present. Thyromegaly (possible mild right thyroid enlargement) present.  Cardiovascular: Normal rate, regular rhythm and normal heart sounds.   Pulmonary/Chest: Effort normal and breath sounds normal. No respiratory distress.  Musculoskeletal: Normal range of motion.  Neurological: She is alert and oriented to person, place, and time.  Skin: Skin is warm and dry. No  rash noted.  Psychiatric: She has a normal mood and affect. Her behavior is normal. Thought content normal.  Nursing note and vitals reviewed.   BP 110/62   Pulse 62   Ht 5\' 3"  (1.6 m)   Wt 128 lb (58.1 kg)   SpO2 98%   BMI 22.67 kg/m   Assessment and Plan: 1. SDH (subdural hematoma) (HCC) Continue rest, tylenol as needed for  headache Avoid alcohol and NSAIDS for the next few weeks May gradually return to normal activity next week  2. DDD (degenerative disc disease), cervical Mild - continue heat and tylenol PRN  3. Right thyroid nodule Will get labs and thyroid US - Thyroid Panel With TSH - US THYROID; Future   Bari EdwardLaura Baby Stairs, MD [facility]  02/13/2017

## 2017-02-14 LAB — THYROID PANEL WITH TSH
Free Thyroxine Index: 1.3 (ref 1.2–4.9)
T3 Uptake Ratio: 21 % — ABNORMAL LOW (ref 24–39)
T4 TOTAL: 6 ug/dL (ref 4.5–12.0)
TSH: 3.82 u[IU]/mL (ref 0.450–4.500)

## 2017-02-15 ENCOUNTER — Ambulatory Visit
Admission: RE | Admit: 2017-02-15 | Discharge: 2017-02-15 | Disposition: A | Discharge status: Home / Self Care | Payer: BLUE CROSS/BLUE SHIELD | Source: Ambulatory Visit | Attending: Internal Medicine | Admitting: Internal Medicine

## 2017-02-15 DIAGNOSIS — E041 Nontoxic single thyroid nodule: Secondary | ICD-10-CM | POA: Diagnosis present

## 2017-02-15 DIAGNOSIS — E042 Nontoxic multinodular goiter: Secondary | ICD-10-CM | POA: Insufficient documentation

## 2017-02-16 ENCOUNTER — Other Ambulatory Visit: Payer: Self-pay | Admitting: Internal Medicine

## 2017-02-16 DIAGNOSIS — E041 Nontoxic single thyroid nodule: Secondary | ICD-10-CM

## 2017-02-16 DIAGNOSIS — E042 Nontoxic multinodular goiter: Secondary | ICD-10-CM | POA: Insufficient documentation

## 2017-02-18 ENCOUNTER — Ambulatory Visit: Payer: BLUE CROSS/BLUE SHIELD

## 2017-06-27 ENCOUNTER — Other Ambulatory Visit: Payer: Self-pay | Admitting: Obstetrics & Gynecology

## 2017-06-27 DIAGNOSIS — Z1231 Encounter for screening mammogram for malignant neoplasm of breast: Secondary | ICD-10-CM

## 2017-07-05 ENCOUNTER — Encounter: Payer: Self-pay | Admitting: Internal Medicine

## 2017-07-05 ENCOUNTER — Ambulatory Visit (INDEPENDENT_AMBULATORY_CARE_PROVIDER_SITE_OTHER): Payer: BLUE CROSS/BLUE SHIELD | Admitting: Internal Medicine

## 2017-07-05 VITALS — BP 108/72 | HR 65 | Temp 97.9°F | Ht 63.0 in | Wt 130.0 lb

## 2017-07-05 DIAGNOSIS — B029 Zoster without complications: Secondary | ICD-10-CM

## 2017-07-05 MED ORDER — VALACYCLOVIR HCL 1 G PO TABS: 1000.0000 mg | ORAL_TABLET | Freq: Three times a day (TID) | ORAL | 0 refills | Status: DC

## 2017-07-05 NOTE — Progress Notes (Signed)
Date:  07/05/2017   Name:  Lisa Blackburn   DOB:  06-Feb-1956   MRN:  [id number]   Chief Complaint: Rash (Left side tingling pain in fron of stomach wrapping around to back. Face was tingling on left side of face today. X 5 days. Rash is located on left mid back. ) Rash  This is a new problem. The current episode started in the past 7 days. The affected locations include the torso. The rash is characterized by itchiness (tingling). She was exposed to nothing. Pertinent negatives include no fatigue, fever or shortness of breath.  The tingling started about 5 days ago but the rash started today on her left back.  She has not noticed a rash on her face. She denies fever or chills, nausea.  She has not taken anything or applied any topical medications.    Review of Systems  Constitutional: Negative for chills, fatigue and fever.  Respiratory: Negative for chest tightness and shortness of breath.   Cardiovascular: Negative for chest pain.  Skin: Positive for color change and rash.  Neurological: Negative for dizziness and headaches.    Patient Active Problem List   Diagnosis Date Noted  . Goiter, nontoxic, multinodular 02/16/2017  . DDD (degenerative disc disease), cervical 02/13/2017  . Right thyroid nodule 02/10/2017  . SDH (subdural hematoma) (HCC) 02/10/2017  . H/O adenomatous polyp of colon 08/19/2015  . Headache, migraine 06/24/2015  . Acute calcific periarthritis 06/24/2015  . Allergic cough 06/24/2015  . Diverticulosis of sigmoid colon 06/24/2015  . Dysfunction of eustachian tube 06/24/2015  . Gastro-esophageal reflux disease without esophagitis 06/24/2015    Prior to Admission medications   Medication Sig Start Date End Date Taking? Authorizing Provider  estradiol (ESTRACE) 0.5 MG tablet Take 0.5 mg by mouth daily.    [provider]  Multiple Vitamin (MULTIVITAMIN) capsule Take 1 capsule by mouth every morning.    [provider]  progesterone  (PROMETRIUM) 100 MG capsule Take by mouth.    [provider]    Allergies  Allergen Reactions  . Codeine Nausea Only    Past Surgical History:  Procedure Laterality Date  . COLONOSCOPY  2010   polyps - repeat 2015  . COLONOSCOPY  2016    Social History  Substance Use Topics  . Smoking status: Never Smoker  . Smokeless tobacco: Never Used  . Alcohol use 0.0 oz/week     Medication list has been reviewed and updated.   Physical Exam  Constitutional: She is oriented to person, place, and time. She appears well-developed. No distress.  HENT:  Head: Normocephalic and atraumatic.  Cardiovascular: Normal rate, regular rhythm and normal heart sounds.   Pulmonary/Chest: Effort normal and breath sounds normal. No respiratory distress. She has no wheezes.  Musculoskeletal: Normal range of motion.  Neurological: She is alert and oriented to person, place, and time.  Skin: Skin is warm and dry. Rash noted. Rash is vesicular.     No lesions on face or tip of nose  Psychiatric: She has a normal mood and affect. Her behavior is normal. Thought content normal.  Nursing note and vitals reviewed.   BP 108/72   Pulse 65   Temp 97.9 F (36.6 C)   Ht 5\' 3"  (1.6 m)   Wt 130 lb (59 kg)   SpO2 97%   BMI 23.03 kg/m   Assessment and Plan: 1. Herpes zoster without complication If lesions occur on tip of nose, see ophthalmology right away Take  tylenol or advil as needed for discomfort - valACYclovir (VALTREX) 1000 MG tablet; Take 1 tablet (1,000 mg total) by mouth 3 (three) times daily.  Dispense: 21 tablet; Refill: 0   Meds ordered this encounter  Medications  . valACYclovir (VALTREX) 1000 MG tablet    Sig: Take 1 tablet (1,000 mg total) by mouth 3 (three) times daily.    Dispense:  21 tablet    Refill:  0    Bari Edward, MD [facility]  07/05/2017

## 2017-07-05 NOTE — Patient Instructions (Signed)
Shingles Shingles, which is also known as herpes zoster, is an infection that causes a painful skin rash and fluid-filled blisters. Shingles is not related to genital herpes, which is a sexually transmitted infection. Shingles only develops in people who:  Have had chickenpox.  Have received the chickenpox vaccine. (This is rare.)  What are the causes? Shingles is caused by varicella-zoster virus (VZV). This is the same virus that causes chickenpox. After exposure to VZV, the virus stays in the body in an inactive (dormant) state. Shingles develops if the virus reactivates. This can happen many years after the initial exposure to VZV. It is not known what causes this virus to reactivate. What increases the risk? People who have had chickenpox or received the chickenpox vaccine are at risk for shingles. Infection is more common in people who:  Are older than age 50.  Have a weakened defense (immune) system, such as those with HIV, AIDS, or cancer.  Are taking medicines that weaken the immune system, such as transplant medicines.  Are under great stress.  What are the signs or symptoms? Early symptoms of this condition include itching, tingling, and pain in an area on your skin. Pain may be described as burning, stabbing, or throbbing. A few days or weeks after symptoms start, a painful red rash appears, usually on one side of the body in a bandlike or beltlike pattern. The rash eventually turns into fluid-filled blisters that break open, scab over, and dry up in about 2-3 weeks. At any time during the infection, you may also develop:  A fever.  Chills.  A headache.  An upset stomach.  How is this diagnosed? This condition is diagnosed with a skin exam. Sometimes, skin or fluid samples are taken from the blisters before a diagnosis is made. These samples are examined under a microscope or sent to a lab for testing. How is this treated? There is no specific cure for this condition.  Your health care provider will probably prescribe medicines to help you manage pain, recover more quickly, and avoid long-term problems. Medicines may include:  Antiviral drugs.  Anti-inflammatory drugs.  Pain medicines.  If the area involved is on your face, you may be referred to a specialist, such as an eye doctor (ophthalmologist) or an ear, nose, and throat (ENT) doctor to help you avoid eye problems, chronic pain, or disability. Follow these instructions at home: Medicines  Take medicines only as directed by your health care provider.  Apply an anti-itch or numbing cream to the affected area as directed by your health care provider. Blister and Rash Care  Take a cool bath or apply cool compresses to the area of the rash or blisters as directed by your health care provider. This may help with pain and itching.  Keep your rash covered with a loose bandage (dressing). Wear loose-fitting clothing to help ease the pain of material rubbing against the rash.  Keep your rash and blisters clean with mild soap and cool water or as directed by your health care provider.  Check your rash every day for signs of infection. These include redness, swelling, and pain that lasts or increases.  Do not pick your blisters.  Do not scratch your rash. General instructions  Rest as directed by your health care provider.  Keep all follow-up visits as directed by your health care provider. This is important.  Until your blisters scab over, your infection can cause chickenpox in people who have never had it or been vaccinated   against it. To prevent this from happening, avoid contact with other people, especially: ? Babies. ? Pregnant women. ? Children who have eczema. ? Elderly people who have transplants. ? People who have chronic illnesses, such as leukemia or AIDS. Contact a health care provider if:  Your pain is not relieved with prescribed medicines.  Your pain does not get better after  the rash heals.  Your rash looks infected. Signs of infection include redness, swelling, and pain that lasts or increases. Get help right away if:  The rash is on your face or nose.  You have facial pain, pain around your eye area, or loss of feeling on one side of your face.  You have ear pain or you have ringing in your ear.  You have loss of taste.  Your condition gets worse. This information is not intended to replace advice given to you by your health care provider. Make sure you discuss any questions you have with your health care provider. Document Released: 12/03/2005 Document Revised: 07/29/2016 Document Reviewed: 10/14/2014 Elsevier Interactive Patient Education  2017 [employer]  

## 2017-07-18 ENCOUNTER — Other Ambulatory Visit: Payer: Self-pay

## 2017-07-18 DIAGNOSIS — E041 Nontoxic single thyroid nodule: Secondary | ICD-10-CM

## 2018-06-25 ENCOUNTER — Ambulatory Visit (INDEPENDENT_AMBULATORY_CARE_PROVIDER_SITE_OTHER): Payer: BLUE CROSS/BLUE SHIELD | Admitting: Internal Medicine

## 2018-06-25 ENCOUNTER — Encounter: Payer: Self-pay | Admitting: Internal Medicine

## 2018-06-25 VITALS — BP 108/62 | HR 86 | Ht 63.0 in | Wt 138.0 lb

## 2018-06-25 DIAGNOSIS — N3001 Acute cystitis with hematuria: Secondary | ICD-10-CM | POA: Diagnosis not present

## 2018-06-25 DIAGNOSIS — G43009 Migraine without aura, not intractable, without status migrainosus: Secondary | ICD-10-CM

## 2018-06-25 LAB — POCT URINALYSIS DIPSTICK
BILIRUBIN UA: NEGATIVE
Glucose, UA: NEGATIVE
Ketones, UA: NEGATIVE
Leukocytes, UA: NEGATIVE
Nitrite, UA: NEGATIVE
Protein, UA: NEGATIVE
SPEC GRAV UA: 1.01 (ref 1.010–1.025)
Urobilinogen, UA: 0.2 E.U./dL
pH, UA: 6 (ref 5.0–8.0)

## 2018-06-25 MED ORDER — SUMATRIPTAN SUCCINATE 50 MG PO TABS: 50.0000 mg | ORAL_TABLET | ORAL | 1 refills | Status: DC | PRN

## 2018-06-25 NOTE — Progress Notes (Signed)
Date:  06/25/2018   Name:  Lisa Blackburn   DOB:  01-13-1956   MRN:  [id number]   Chief Complaint: Urinary Tract Infection (frequent urinating---denied pain) and Migraine (spotting, dull, aching, dizzy--statrted this morning) Urinary Tract Infection   This is a new problem. The current episode started in the past 7 days. The patient is experiencing no pain. There has been no fever. Associated symptoms include frequency and nausea. Pertinent negatives include no chills, hematuria, urgency or vomiting. She has tried nothing for the symptoms.  Migraine   This is a recurrent problem. The current episode started today. The problem has been gradually improving. The pain is located in the temporal region. The quality of the pain is described as squeezing and throbbing. Associated symptoms include nausea, photophobia and a visual change. Pertinent negatives include no dizziness, fever, numbness or vomiting. Nothing aggravates the symptoms. She has tried acetaminophen for the symptoms. The treatment provided moderate relief. Her past medical history is significant for migraine headaches.  She was just a bit concerned when she had another headache today.  It is better after tylenol.  Mild photophobia and nausea only.    Review of Systems  Constitutional: Negative for chills, fatigue and fever.  Eyes: Positive for photophobia.  Respiratory: Negative for choking and shortness of breath.   Cardiovascular: Negative for chest pain.  Gastrointestinal: Positive for nausea. Negative for vomiting.  Genitourinary: Positive for frequency. Negative for dysuria, hematuria, pelvic pain and urgency.  Neurological: Positive for headaches. Negative for dizziness, syncope and numbness.    Patient Active Problem List   Diagnosis Date Noted  . Goiter, nontoxic, multinodular 02/16/2017  . DDD (degenerative disc disease), cervical 02/13/2017  . Right thyroid nodule 02/10/2017  . SDH (subdural hematoma) (HCC)  02/10/2017  . H/O adenomatous polyp of colon 08/19/2015  . Headache, migraine 06/24/2015  . Acute calcific periarthritis 06/24/2015  . Allergic cough 06/24/2015  . Diverticulosis of sigmoid colon 06/24/2015  . Dysfunction of eustachian tube 06/24/2015  . Gastro-esophageal reflux disease without esophagitis 06/24/2015    Prior to Admission medications   Medication Sig Start Date End Date Taking? Authorizing Provider  Acetaminophen (TYLENOL PO) Take by mouth.   Yes [provider]  ASPIRIN 81 PO Take by mouth.   Yes [provider]  Multiple Vitamin (MULTIVITAMIN) capsule Take 1 capsule by mouth every morning.   Yes [provider]    Allergies  Allergen Reactions  . Codeine Nausea Only    Past Surgical History:  Procedure Laterality Date  . COLONOSCOPY  2010   polyps - repeat 2015  . COLONOSCOPY  2016    Social History   Tobacco Use  . Smoking status: Never Smoker  . Smokeless tobacco: Never Used  Substance Use Topics  . Alcohol use: Yes    Alcohol/week: 0.0 oz  . Drug use: No     Medication list has been reviewed and updated.  Current Meds  Medication Sig  . Acetaminophen (TYLENOL PO) Take by mouth.  . ASPIRIN 81 PO Take by mouth.  . Multiple Vitamin (MULTIVITAMIN) capsule Take 1 capsule by mouth every morning.    No flowsheet data found.  Physical Exam  Constitutional: She is oriented to person, place, and time. She appears well-developed and well-nourished.  Eyes: Pupils are equal, round, and reactive to light. Conjunctivae and EOM are normal.  Cardiovascular: Normal rate, regular rhythm and normal heart sounds.  Pulmonary/Chest: Effort normal and breath sounds normal. No respiratory distress.  Abdominal: Soft. Bowel sounds are normal. There is tenderness in the suprapubic area. There is no rebound, no guarding and no CVA tenderness.  Neurological: She is alert and oriented to person, place, and time. She has normal strength. Gait  normal.  Psychiatric: She has a normal mood and affect. Her speech is normal.  Nursing note and vitals reviewed.   BP 108/62   Pulse 86   Ht 5\' 3"  (1.6 m)   Wt 138 lb (62.6 kg)   SpO2 99%   BMI 24.45 kg/m   Assessment and Plan: 1. Acute cystitis with hematuria UA unremarkable - continue adequate fluids Return PRN - POCT urinalysis dipstick  2. Migraine without aura and without status migrainosus, not intractable Rx for imitrex given Can also use excedrin migraine for milder headaches - SUMAtriptan (IMITREX) 50 MG tablet; Take 1 tablet (50 mg total) by mouth every 2 (two) hours as needed for migraine. May repeat in 2 hours if headache persists or recurs.  Dispense: 10 tablet; Refill: 1   Meds ordered this encounter  Medications  . SUMAtriptan (IMITREX) 50 MG tablet    Sig: Take 1 tablet (50 mg total) by mouth every 2 (two) hours as needed for migraine. May repeat in 2 hours if headache persists or recurs.    Dispense:  10 tablet    Refill:  1    Partially dictated using Animal nutritionist. Any errors are unintentional.  Bari Edward, MD [facility]  06/25/2018

## 2019-03-19 ENCOUNTER — Other Ambulatory Visit: Payer: Self-pay

## 2019-03-20 ENCOUNTER — Ambulatory Visit (INDEPENDENT_AMBULATORY_CARE_PROVIDER_SITE_OTHER): Payer: BLUE CROSS/BLUE SHIELD | Admitting: Internal Medicine

## 2019-03-20 ENCOUNTER — Encounter: Payer: Self-pay | Admitting: Internal Medicine

## 2019-03-20 VITALS — BP 132/70 | HR 68 | Temp 98.0°F | Ht 63.0 in | Wt 123.6 lb

## 2019-03-20 DIAGNOSIS — J302 Other seasonal allergic rhinitis: Secondary | ICD-10-CM

## 2019-03-20 NOTE — Patient Instructions (Signed)
Take Allegra or Claritin or Zyrtec once a day  Continue Mucinex and Robitussin

## 2019-03-20 NOTE — Progress Notes (Signed)
Date:  03/20/2019   Name:  Lisa Blackburn   DOB:  23-Dec-1955   MRN:  [id number]   Chief Complaint: Cough (Cough runny nose and wheezing. No travel. Bringing up clear mucous. )  Cough  This is a new problem. The current episode started in the past 7 days. The problem has been unchanged. The cough is non-productive. Associated symptoms include nasal congestion, postnasal drip, a sore throat and wheezing. Pertinent negatives include no chest pain, chills, ear pain, fever, headaches or shortness of breath. The symptoms are aggravated by pollens. Her past medical history is significant for environmental allergies.  She does not smoke. She has been taking Robitussin and mucinex. She does not cough at night while sleeping.  She did have mild wheezing yesterday but not today.  Review of Systems  Constitutional: Negative for chills and fever.  HENT: Positive for postnasal drip and sore throat. Negative for ear discharge, ear pain, sinus pressure, sinus pain and trouble swallowing.   Eyes: Negative for visual disturbance.  Respiratory: Positive for cough and wheezing. Negative for shortness of breath.   Cardiovascular: Negative for chest pain and palpitations.  Gastrointestinal: Negative for abdominal pain, diarrhea and nausea.  Allergic/Immunologic: Positive for environmental allergies.  Neurological: Negative for dizziness and headaches.    Patient Active Problem List   Diagnosis Date Noted  . Goiter, nontoxic, multinodular 02/16/2017  . DDD (degenerative disc disease), cervical 02/13/2017  . Right thyroid nodule 02/10/2017  . SDH (subdural hematoma) (HCC) 02/10/2017  . H/O adenomatous polyp of colon 08/19/2015  . Headache, migraine 06/24/2015  . Acute calcific periarthritis 06/24/2015  . Allergic cough 06/24/2015  . Diverticulosis of sigmoid colon 06/24/2015  . Dysfunction of eustachian tube 06/24/2015  . Gastro-esophageal reflux disease without esophagitis 06/24/2015    Allergies   Allergen Reactions  . Codeine Nausea Only    Past Surgical History:  Procedure Laterality Date  . COLONOSCOPY  2010   polyps - repeat 2015  . COLONOSCOPY  2016    Social History   Tobacco Use  . Smoking status: Never Smoker  . Smokeless tobacco: Never Used  Substance Use Topics  . Alcohol use: Yes    Alcohol/week: 0.0 standard drinks  . Drug use: No     Medication list has been reviewed and updated.  Current Meds  Medication Sig  . Acetaminophen (TYLENOL PO) Take by mouth.  . ASPIRIN 81 PO Take by mouth.  . Multiple Vitamin (MULTIVITAMIN) capsule Take 1 capsule by mouth every morning.    PHQ 2/9 Scores 03/20/2019 06/25/2018  PHQ - 2 Score 0 0    BP Readings from Last 3 Encounters:  03/20/19 132/70  06/25/18 108/62  07/05/17 108/72    Physical Exam Constitutional:      Appearance: She is well-developed.  HENT:     Right Ear: Ear canal and external ear normal. Tympanic membrane is not erythematous or retracted.     Left Ear: Ear canal and external ear normal. Tympanic membrane is not erythematous or retracted.     Nose:     Right Sinus: No maxillary sinus tenderness or frontal sinus tenderness.     Left Sinus: No maxillary sinus tenderness or frontal sinus tenderness.     Mouth/Throat:     Mouth: No oral lesions.     Pharynx: Uvula midline. Posterior oropharyngeal erythema present. No oropharyngeal exudate.  Neck:     Musculoskeletal: Normal range of motion and neck supple.     Vascular: No  carotid bruit.  Cardiovascular:     Rate and Rhythm: Normal rate and regular rhythm.     Heart sounds: Normal heart sounds.  Pulmonary:     Breath sounds: Normal breath sounds. No wheezing or rales.  Lymphadenopathy:     Cervical: No cervical adenopathy.  Neurological:     Mental Status: She is alert and oriented to person, place, and time.     Wt Readings from Last 3 Encounters:  03/20/19 123 lb 9.6 oz (56.1 kg)  06/25/18 138 lb (62.6 kg)  07/05/17 130 lb (59  kg)    BP 132/70   Pulse 68   Temp 98 F (36.7 C) (Oral)   Ht 5\' 3"  (1.6 m)   Wt 123 lb 9.6 oz (56.1 kg)   SpO2 97%   BMI 21.89 kg/m   Assessment and Plan: 1. Seasonal allergic rhinitis, unspecified trigger Take anti-histamine otc as needed Continue robitussin and mucinex   Partially dictated using Animal nutritionist. Any errors are unintentional.  Bari Edward, MD [facility]  03/20/2019

## 2019-07-01 ENCOUNTER — Ambulatory Visit (INDEPENDENT_AMBULATORY_CARE_PROVIDER_SITE_OTHER): Payer: BLUE CROSS/BLUE SHIELD | Admitting: Internal Medicine

## 2019-07-01 ENCOUNTER — Telehealth: Payer: Self-pay

## 2019-07-01 ENCOUNTER — Other Ambulatory Visit: Payer: Self-pay

## 2019-07-01 ENCOUNTER — Other Ambulatory Visit: Payer: Self-pay | Admitting: Internal Medicine

## 2019-07-01 ENCOUNTER — Encounter: Payer: Self-pay | Admitting: Internal Medicine

## 2019-07-01 VITALS — BP 118/60 | HR 67 | Ht 63.0 in | Wt 125.0 lb

## 2019-07-01 DIAGNOSIS — K5732 Diverticulitis of large intestine without perforation or abscess without bleeding: Secondary | ICD-10-CM

## 2019-07-01 DIAGNOSIS — Z1211 Encounter for screening for malignant neoplasm of colon: Secondary | ICD-10-CM

## 2019-07-01 MED ORDER — AMOXICILLIN-POT CLAVULANATE 875-125 MG PO TABS: 1.0000 | ORAL_TABLET | Freq: Two times a day (BID) | ORAL | 0 refills | Status: AC

## 2019-07-01 NOTE — Telephone Encounter (Signed)
Done

## 2019-07-01 NOTE — Progress Notes (Signed)
Date:  07/01/2019   Name:  Lisa Blackburn   DOB:  December 23, 1955   MRN:  [id number]   Chief Complaint: Abdominal Pain (Left lower stomach pains. Has had diverticulitis in the past. Was consitpated 1.5 wks ago and now back to normal stools. Last BM this morning - normal. Tomato seeds/ stress coudl have caused.)  Abdominal Pain This is a recurrent problem. The current episode started 1 to 4 weeks ago. The onset quality is gradual. The problem occurs constantly. The pain is located in the LLQ. The pain is mild. The quality of the pain is colicky and cramping. Associated symptoms include diarrhea. Pertinent negatives include no fever, hematochezia, nausea or vomiting.    Review of Systems  Constitutional: Negative for chills, fatigue and fever.  Respiratory: Negative for choking, shortness of breath and wheezing.   Cardiovascular: Negative for chest pain.  Gastrointestinal: Positive for abdominal pain and diarrhea. Negative for blood in stool, hematochezia, nausea and vomiting.    Patient Active Problem List   Diagnosis Date Noted  . Goiter, nontoxic, multinodular 02/16/2017  . DDD (degenerative disc disease), cervical 02/13/2017  . Right thyroid nodule 02/10/2017  . SDH (subdural hematoma) ([place]) 02/10/2017  . H/O adenomatous polyp of colon 08/19/2015  . Headache, migraine 06/24/2015  . Acute calcific periarthritis 06/24/2015  . Allergic cough 06/24/2015  . Diverticulosis of sigmoid colon 06/24/2015  . Dysfunction of eustachian tube 06/24/2015  . Gastro-esophageal reflux disease without esophagitis 06/24/2015    Allergies  Allergen Reactions  . Codeine Nausea Only    Past Surgical History:  Procedure Laterality Date  . COLONOSCOPY  2010   polyps - repeat 2015  . COLONOSCOPY  2016    Social History   Tobacco Use  . Smoking status: Never Smoker  . Smokeless tobacco: Never Used  Substance Use Topics  . Alcohol use: Yes    Alcohol/week: 0.0 standard drinks  . Drug use:  No     Medication list has been reviewed and updated.  Current Meds  Medication Sig  . Acetaminophen (TYLENOL PO) Take by mouth.  . Multiple Vitamin (MULTIVITAMIN) capsule Take 1 capsule by mouth every morning.    PHQ 2/9 Scores 07/01/2019 03/20/2019 06/25/2018  PHQ - 2 Score 0 0 0  PHQ- 9 Score 0 - -    BP Readings from Last 3 Encounters:  07/01/19 118/60  03/20/19 132/70  06/25/18 108/62    Physical Exam Vitals signs and nursing note reviewed.  Constitutional:      General: She is not in acute distress.    Appearance: She is well-developed.  HENT:     Head: Normocephalic and atraumatic.  Cardiovascular:     Rate and Rhythm: Normal rate and regular rhythm.  Pulmonary:     Effort: Pulmonary effort is normal. No respiratory distress.     Breath sounds: Normal breath sounds.  Abdominal:     General: Abdomen is flat. Bowel sounds are decreased. There is no distension.     Palpations: Abdomen is soft.     Tenderness: There is abdominal tenderness in the left lower quadrant. There is no right CVA tenderness, left CVA tenderness, guarding or rebound.  Musculoskeletal: Normal range of motion.  Skin:    General: Skin is warm and dry.     Findings: No rash.  Neurological:     Mental Status: She is alert and oriented to person, place, and time.  Psychiatric:        Behavior: Behavior normal.  Thought Content: Thought content normal.     Wt Readings from Last 3 Encounters:  07/01/19 125 lb (56.7 kg)  03/20/19 123 lb 9.6 oz (56.1 kg)  06/25/18 138 lb (62.6 kg)    BP 118/60   Pulse 67   Ht 5\' 3"  (1.6 m)   Wt 125 lb (56.7 kg)   SpO2 97%   BMI 22.14 kg/m   Assessment and Plan: 1. Diverticulitis of large intestine without perforation or abscess without bleeding Continue fluids, rest - amoxicillin-clavulanate (AUGMENTIN) 875-125 MG tablet; Take 1 tablet by mouth 2 (two) times daily for 10 days.  Dispense: 20 tablet; Refill: 0   Partially dictated using [employer]  software. Any errors are unintentional.  Bari EdwardLaura Indianna Boran, MD [facility]  07/01/2019

## 2019-07-01 NOTE — Telephone Encounter (Signed)
Patient would like to be referred to Dr. Lacey Jensen in [place] for diverticulitis and she is 5 years overdue for her colonoscopy.  - Please advise.

## 2019-09-29 ENCOUNTER — Encounter: Payer: Self-pay | Admitting: Emergency Medicine

## 2019-09-29 ENCOUNTER — Other Ambulatory Visit: Payer: Self-pay

## 2019-09-29 ENCOUNTER — Ambulatory Visit
Admission: EM | Admit: 2019-09-29 | Discharge: 2019-09-29 | Disposition: A | Discharge status: Home / Self Care | Payer: BLUE CROSS/BLUE SHIELD | Attending: Family Medicine | Admitting: Family Medicine

## 2019-09-29 DIAGNOSIS — M545 Low back pain, unspecified: Secondary | ICD-10-CM

## 2019-09-29 LAB — URINALYSIS, COMPLETE (UACMP) WITH MICROSCOPIC
Bilirubin Urine: NEGATIVE
Glucose, UA: NEGATIVE mg/dL
Ketones, ur: NEGATIVE mg/dL
Leukocytes,Ua: NEGATIVE
Nitrite: NEGATIVE
Protein, ur: NEGATIVE mg/dL
Specific Gravity, Urine: 1.015 (ref 1.005–1.030)
pH: 7 (ref 5.0–8.0)

## 2019-09-29 MED ORDER — MELOXICAM 15 MG PO TABS: 15.0000 mg | ORAL_TABLET | Freq: Every day | ORAL | 0 refills | Status: DC | PRN

## 2019-09-29 NOTE — Discharge Instructions (Signed)
Medication as directed.  No naproxen or other anti-inflammatories while taking prescribed medication.  Take care  Dr. Lacinda Axon

## 2019-09-29 NOTE — ED Provider Notes (Signed)
[facility]    CSN: [id number] Arrival date & time: 09/29/19  1208  History   Chief Complaint Chief Complaint  Patient presents with  . Back Pain   HPI  63 year old female presents with low back pain.  2-day history of bilateral low back pain.  Denies fall, trauma, injury.  Patient states that she has taken naproxen and used ice as well as heat without resolution.  She does state that it has slightly improved.  She rates her pain as 7/10 in severity currently.  No radicular symptoms.  No urinary symptoms.  No reports of saddle anesthesia.  Worse with certain activities like bending down and rolling over in bed.  No other reported symptoms.  No other complaints.  PMH, Surgical Hx, Family Hx, Social History reviewed and updated as below.  PMH: Patient Active Problem List   Diagnosis Date Noted  . Goiter, nontoxic, multinodular 02/16/2017  . DDD (degenerative disc disease), cervical 02/13/2017  . Right thyroid nodule 02/10/2017  . SDH (subdural hematoma) (HCC) 02/10/2017  . H/O adenomatous polyp of colon 08/19/2015  . Headache, migraine 06/24/2015  . Acute calcific periarthritis 06/24/2015  . Allergic cough 06/24/2015  . Diverticulosis of sigmoid colon 06/24/2015  . Dysfunction of eustachian tube 06/24/2015  . Gastro-esophageal reflux disease without esophagitis 06/24/2015   Past Surgical History:  Procedure Laterality Date  . COLONOSCOPY  2010   polyps - repeat 2015  . COLONOSCOPY  2016   OB History   No obstetric history on file.    Home Medications    Prior to Admission medications   Medication Sig Start Date End Date Taking? Authorizing Provider  Acetaminophen (TYLENOL PO) Take by mouth.   Yes [provider]  Multiple Vitamin (MULTIVITAMIN) capsule Take 1 capsule by mouth every morning.   Yes [provider]  meloxicam (MOBIC) 15 MG tablet Take 1 tablet (15 mg total) by mouth daily as needed for pain. 09/29/19   Tommie Sams, DO    Family History Family History  Problem Relation Age of Onset  . Hypertension Mother   . Hyperlipidemia Mother   . Diabetes Mother   . Hypertension Father    Social History Social History   Tobacco Use  . Smoking status: Former Games developer  . Smokeless tobacco: Never Used  Substance Use Topics  . Alcohol use: Yes    Alcohol/week: 0.0 standard drinks  . Drug use: No   Allergies   Codeine   Review of Systems Review of Systems  Constitutional: Negative.   Genitourinary: Negative.   Musculoskeletal: Positive for back pain.   Physical Exam Triage Vital Signs ED Triage Vitals  Enc Vitals Group     BP 09/29/19 1233 127/72     Pulse Rate 09/29/19 1233 65     Resp 09/29/19 1233 16     Temp 09/29/19 1233 98.3 F (36.8 C)     Temp Source 09/29/19 1233 Oral     SpO2 09/29/19 1233 99 %     Weight 09/29/19 1231 125 lb (56.7 kg)     Height 09/29/19 1231 5\' 3"  (1.6 m)     Head Circumference --      Peak Flow --      Pain Score 09/29/19 1231 7     Pain Loc --      Pain Edu? --      Excl. in GC? --    Updated Vital Signs BP 127/72 (BP Location: Left Arm)   Pulse 65  Temp 98.3 F (36.8 C) (Oral)   Resp 16   Ht 5\' 3"  (1.6 m)   Wt 56.7 kg   SpO2 99%   BMI 22.14 kg/m   Visual Acuity Right Eye Distance:   Left Eye Distance:   Bilateral Distance:    Right Eye Near:   Left Eye Near:    Bilateral Near:     Physical Exam Vitals signs and nursing note reviewed.  Constitutional:      General: She is not in acute distress.    Appearance: Normal appearance. She is normal weight. She is not ill-appearing.  HENT:     Head: Normocephalic and atraumatic.  Eyes:     General:        Right eye: No discharge.        Left eye: No discharge.     Conjunctiva/sclera: Conjunctivae normal.  Cardiovascular:     Rate and Rhythm: Normal rate and regular rhythm.     Heart sounds: No murmur.  Pulmonary:     Effort: Pulmonary effort is normal.     Breath sounds: Normal breath sounds.  No wheezing, rhonchi or rales.  Musculoskeletal:     Comments: Lumbar spine nontender to palpation.  Decreased range of motion secondary to pain.  Neurological:     Mental Status: She is alert.  Psychiatric:        Mood and Affect: Mood normal.        Behavior: Behavior normal.    UC Treatments / Results  Labs (all labs ordered are listed, but only abnormal results are displayed) Labs Reviewed  URINALYSIS, COMPLETE (UACMP) WITH MICROSCOPIC - Abnormal; Notable for the following components:      Result Value   Hgb urine dipstick TRACE (*)    Bacteria, UA RARE (*)    All other components within normal limits    EKG   Radiology No results found.  Procedures Procedures (including critical care time)  Medications Ordered in UC Medications - No data to display  Initial Impression / Assessment and Plan / UC Course  I have reviewed the triage vital signs and the nursing notes.  Pertinent labs & imaging results that were available during my care of the patient were reviewed by me and considered in my medical decision making (see chart for details).    63 year old female presents with acute low back pain.  Stopping naproxen.  Starting meloxicam.  Rest, ice, heat, supportive care.  Final Clinical Impressions(s) / UC Diagnoses   Final diagnoses:  Acute bilateral low back pain without sciatica     Discharge Instructions     Medication as directed.  No naproxen or other anti-inflammatories while taking prescribed medication.  Take care  Dr. Lacinda Axon    ED Prescriptions    Medication Sig Dispense Auth. Provider   meloxicam (MOBIC) 15 MG tablet Take 1 tablet (15 mg total) by mouth daily as needed for pain. 30 tablet Coral Spikes, DO     PDMP not reviewed this encounter.   Coral Spikes, DO 09/29/19 1351

## 2019-09-29 NOTE — ED Triage Notes (Signed)
Pt c/o lower back pain. She has been stretching, taking naproxen, and icing her back. Started about 2 days ago. She states that it has improved but she wants to make sure nothing more serious is going on. Denies urinary symptoms. No known injury.

## 2019-10-02 ENCOUNTER — Other Ambulatory Visit: Payer: Self-pay | Admitting: Internal Medicine

## 2019-10-02 ENCOUNTER — Telehealth: Payer: Self-pay

## 2019-10-02 DIAGNOSIS — M545 Low back pain, unspecified: Secondary | ICD-10-CM

## 2019-10-02 MED ORDER — CYCLOBENZAPRINE HCL 10 MG PO TABS: 10.0000 mg | ORAL_TABLET | Freq: Three times a day (TID) | ORAL | 0 refills | Status: DC | PRN

## 2019-10-02 NOTE — Telephone Encounter (Signed)
Called and informed Mrs Lisa Blackburn. She will get medication for pt and verbalized understanding.

## 2019-10-02 NOTE — Telephone Encounter (Signed)
Leodis Binet- mother of patient- called saying patient is in a lot of pain in her lower back. Seen UC 3 days ago and they did not do a XR. Was given Meloxicam but not getting any better.  Please advise.

## 2019-10-02 NOTE — Telephone Encounter (Signed)
I recommend continuing heat/ice, Meloxicam and I sent in Flexeril 10 mg three times a day.

## 2019-10-28 ENCOUNTER — Other Ambulatory Visit: Payer: Self-pay | Admitting: Family Medicine

## 2020-05-09 ENCOUNTER — Other Ambulatory Visit: Payer: Self-pay | Admitting: Internal Medicine

## 2020-05-09 DIAGNOSIS — Z1231 Encounter for screening mammogram for malignant neoplasm of breast: Secondary | ICD-10-CM

## 2021-01-31 LAB — HM HEPATITIS C SCREENING LAB: HM Hepatitis Screen: NEGATIVE

## 2021-05-11 LAB — HM COLONOSCOPY

## 2021-05-30 LAB — HM MAMMOGRAPHY

## 2022-11-15 DIAGNOSIS — H401131 Primary open-angle glaucoma, bilateral, mild stage: Secondary | ICD-10-CM | POA: Diagnosis not present

## 2023-01-18 ENCOUNTER — Ambulatory Visit: Payer: BLUE CROSS/BLUE SHIELD | Admitting: Family Medicine

## 2023-01-30 DIAGNOSIS — R109 Unspecified abdominal pain: Secondary | ICD-10-CM | POA: Diagnosis not present

## 2023-01-30 DIAGNOSIS — Z7185 Encounter for immunization safety counseling: Secondary | ICD-10-CM | POA: Diagnosis not present

## 2023-01-30 DIAGNOSIS — Z780 Asymptomatic menopausal state: Secondary | ICD-10-CM | POA: Diagnosis not present

## 2023-02-12 DIAGNOSIS — Z13820 Encounter for screening for osteoporosis: Secondary | ICD-10-CM | POA: Diagnosis not present

## 2023-02-12 DIAGNOSIS — Z780 Asymptomatic menopausal state: Secondary | ICD-10-CM | POA: Diagnosis not present

## 2023-02-12 DIAGNOSIS — M8588 Other specified disorders of bone density and structure, other site: Secondary | ICD-10-CM | POA: Diagnosis not present

## 2023-02-12 LAB — HM DEXA SCAN: HM Dexa Scan: NORMAL

## 2023-02-13 DIAGNOSIS — M8580 Other specified disorders of bone density and structure, unspecified site: Secondary | ICD-10-CM | POA: Insufficient documentation

## 2023-05-29 ENCOUNTER — Ambulatory Visit (INDEPENDENT_AMBULATORY_CARE_PROVIDER_SITE_OTHER): Payer: Medicare HMO | Admitting: Internal Medicine

## 2023-05-29 ENCOUNTER — Encounter: Payer: Self-pay | Admitting: Internal Medicine

## 2023-05-29 VITALS — BP 118/74 | HR 80 | Ht 63.0 in | Wt 120.0 lb

## 2023-05-29 DIAGNOSIS — M8580 Other specified disorders of bone density and structure, unspecified site: Secondary | ICD-10-CM | POA: Diagnosis not present

## 2023-05-29 DIAGNOSIS — Z86010 Personal history of colonic polyps: Secondary | ICD-10-CM | POA: Diagnosis not present

## 2023-05-29 DIAGNOSIS — E782 Mixed hyperlipidemia: Secondary | ICD-10-CM | POA: Diagnosis not present

## 2023-05-29 DIAGNOSIS — Z23 Encounter for immunization: Secondary | ICD-10-CM | POA: Diagnosis not present

## 2023-05-29 NOTE — Assessment & Plan Note (Addendum)
Noted on recent DEXA Continue calcium and vitamin D daily

## 2023-05-29 NOTE — Assessment & Plan Note (Signed)
Moderate elevation in LDL several months ago. Now on mediterranean diet and has lost 10 lbs Will recheck lipids and advise

## 2023-05-29 NOTE — Progress Notes (Signed)
Date:  05/29/2023   Name:  Lisa Blackburn   DOB:  Dec 11, 1956   MRN:  [id number]   Chief Complaint: New Patient (Initial Visit) and Hyperlipidemia  Hyperlipidemia This is a chronic problem. The problem is uncontrolled. Recent lipid tests were reviewed and are high. Pertinent negatives include no chest pain or shortness of breath. Current antihyperlipidemic treatment includes diet change.  She has lost 10 lbs and would like to have labs rechecked to see if she has made progress. The 10-year ASCVD risk score (Arnett DK, et al., 2019) is: 4.9%   Values used to calculate the score:     Age: 67 years     Sex: Female     Is Non-Hispanic African American: No     Diabetic: No     Tobacco smoker: No     Systolic Blood Pressure: 118 mmHg     Is BP treated: No     HDL Cholesterol: 63 mg/dL     Total Cholesterol: 235 mg/dL   Lab Results  Component Value Date   NA 143 01/01/2015   K 3.8 01/01/2015   CO2 26 01/01/2015   GLUCOSE 113 (H) 01/01/2015   BUN 11 01/01/2015   CREATININE 0.68 01/01/2015   CALCIUM 8.9 01/01/2015   GFRNONAA >60 01/01/2015   No results found for: "CHOL", "HDL", "LDLCALC", "LDLDIRECT", "TRIG", "CHOLHDL" Lab Results  Component Value Date   TSH 3.820 02/13/2017   No results found for: "HGBA1C" Lab Results  Component Value Date   WBC 6.9 01/01/2015   HGB 12.9 01/01/2015   HCT 39.1 01/01/2015   MCV 93 01/01/2015   PLT 201 01/01/2015   Lab Results  Component Value Date   ALT 23 01/01/2015   AST 14 (L) 01/01/2015   ALKPHOS 69 01/01/2015   BILITOT 0.5 01/01/2015   No results found for: "25OHVITD2", "25OHVITD3", "VD25OH"   Review of Systems  Constitutional:  Negative for chills, fatigue, fever and unexpected weight change.  HENT:  Negative for trouble swallowing.   Respiratory:  Negative for chest tightness and shortness of breath.   Cardiovascular:  Negative for chest pain and palpitations.  Neurological:  Negative for dizziness, light-headedness  and headaches.  Psychiatric/Behavioral:  Negative for dysphoric mood and sleep disturbance. The patient is not nervous/anxious.     Patient Active Problem List   Diagnosis Date Noted   Mixed hyperlipidemia 05/29/2023   Osteopenia 02/13/2023   Goiter, nontoxic, multinodular 02/16/2017   DDD (degenerative disc disease), cervical 02/13/2017   Right thyroid nodule 02/10/2017   History of subdural hematoma (post traumatic) 02/10/2017   H/O adenomatous polyp of colon 08/19/2015   Headache, migraine 06/24/2015   Diverticulosis of sigmoid colon 06/24/2015   Dysfunction of eustachian tube 06/24/2015   Gastro-esophageal reflux disease without esophagitis 06/24/2015    Allergies  Allergen Reactions   Codeine Nausea Only    Past Surgical History:  Procedure Laterality Date   COLONOSCOPY  2010   polyps - repeat 2015   COLONOSCOPY  2016    Social History   Tobacco Use   Smoking status: Former    Packs/day: 0.25    Years: 5.00    Additional pack years: 0.00    Total pack years: 1.25    Types: Cigarettes    Quit date: 1980    Years since quitting: 44.4   Smokeless tobacco: Never  Vaping Use   Vaping Use: Never used  Substance Use Topics   Alcohol use: Yes  Comment: occasional   Drug use: No     Medication list has been reviewed and updated.  Current Meds  Medication Sig   Acetaminophen (TYLENOL PO) Take by mouth.   Calcium 200 MG TABS Take by mouth.   cholecalciferol (VITAMIN D3) 25 MCG (1000 UNIT) tablet Take 1,000 Units by mouth daily.   cyanocobalamin (VITAMIN B12) 500 MCG tablet Take 500 mcg by mouth daily.   Multiple Vitamin (MULTIVITAMIN) capsule Take 1 capsule by mouth every morning.       05/29/2023    2:34 PM  GAD 7 : Generalized Anxiety Score  Nervous, Anxious, on Edge 0  Control/stop worrying 0  Worry too much - different things 0  Trouble relaxing 0  Restless 1  Easily annoyed or irritable 0  Afraid - awful might happen 0  Total GAD 7 Score 1   Anxiety Difficulty Not difficult at all       05/29/2023    2:34 PM 07/01/2019    9:40 AM 03/20/2019    2:55 PM  Depression screen PHQ 2/9  Decreased Interest 0 0 0  Down, Depressed, Hopeless 0 0 0  PHQ - 2 Score 0 0 0  Altered sleeping 0 0   Tired, decreased energy 0 0   Change in appetite 0 0   Feeling bad or failure about yourself  0 0   Trouble concentrating 0 0   Moving slowly or fidgety/restless 0 0   Suicidal thoughts 0 0   PHQ-9 Score 0 0   Difficult doing work/chores Not difficult at all Not difficult at all     BP Readings from Last 3 Encounters:  05/29/23 118/74  09/29/19 127/72  07/01/19 118/60    Physical Exam Vitals and nursing note reviewed.  Constitutional:      General: She is not in acute distress.    Appearance: Normal appearance. She is well-developed.  HENT:     Head: Normocephalic and atraumatic.  Neck:     Vascular: No carotid bruit.  Cardiovascular:     Rate and Rhythm: Normal rate and regular rhythm.     Heart sounds: No murmur heard. Pulmonary:     Effort: Pulmonary effort is normal. No respiratory distress.     Breath sounds: No wheezing or rhonchi.  Musculoskeletal:     Cervical back: Normal range of motion.     Right lower leg: No edema.     Left lower leg: No edema.  Lymphadenopathy:     Cervical: No cervical adenopathy.  Skin:    General: Skin is warm and dry.     Findings: No rash.  Neurological:     General: No focal deficit present.     Mental Status: She is alert and oriented to person, place, and time.  Psychiatric:        Mood and Affect: Mood normal.        Behavior: Behavior normal.     Wt Readings from Last 3 Encounters:  05/29/23 120 lb (54.4 kg)  09/29/19 125 lb (56.7 kg)  07/01/19 125 lb (56.7 kg)    BP 118/74   Pulse 80   Ht 5\' 3"  (1.6 m)   Wt 120 lb (54.4 kg)   SpO2 100%   BMI 21.26 kg/m   Assessment and Plan:  Problem List Items Addressed This Visit     Osteopenia    Noted on recent  DEXA Continue calcium and vitamin D daily      Mixed hyperlipidemia -  Primary    Moderate elevation in LDL several months ago. Now on mediterranean diet and has lost 10 lbs Will recheck lipids and advise      Relevant Orders   Lipid panel   Direct LDL   H/O adenomatous polyp of colon   Other Visit Diagnoses     Need for vaccination for pneumococcus       Relevant Orders   Pneumococcal conjugate vaccine 20-valent (Completed)       Return in about 6 months (around 11/28/2023) for CPX.   Partially dictated using Dragon software, any errors are not intentional.  Reubin Milan, MD [facility] [place], [place]

## 2023-05-30 LAB — LIPID PANEL
Chol/HDL Ratio: 3.1 ratio (ref 0.0–4.4)
Cholesterol, Total: 206 mg/dL — ABNORMAL HIGH (ref 100–199)
HDL: 67 mg/dL (ref >39)
LDL Chol Calc (NIH): 111 mg/dL — ABNORMAL HIGH (ref 0–99)
Triglycerides: 164 mg/dL — ABNORMAL HIGH (ref 0–149)
VLDL Cholesterol Cal: 28 mg/dL (ref 5–40)

## 2023-05-30 LAB — LDL CHOLESTEROL, DIRECT: LDL Direct: 116 mg/dL — ABNORMAL HIGH (ref 0–99)

## 2023-05-31 NOTE — Progress Notes (Signed)
PC called discussed las voiced understanding.

## 2023-06-18 DIAGNOSIS — H401131 Primary open-angle glaucoma, bilateral, mild stage: Secondary | ICD-10-CM | POA: Diagnosis not present

## 2023-07-08 ENCOUNTER — Telehealth: Payer: Self-pay | Admitting: Internal Medicine

## 2023-07-08 NOTE — Telephone Encounter (Signed)
Referral Request - Has patient seen PCP for this complaint? yes *If NO, is insurance requiring patient see PCP for this issue before PCP can refer them? Referral for which specialty: mammogram Preferred provider/office: ? Reason for referral: breast fills tender

## 2023-07-10 ENCOUNTER — Encounter: Payer: Self-pay | Admitting: Internal Medicine

## 2023-07-10 ENCOUNTER — Ambulatory Visit (INDEPENDENT_AMBULATORY_CARE_PROVIDER_SITE_OTHER): Payer: Medicare HMO | Admitting: Internal Medicine

## 2023-07-10 VITALS — BP 128/62 | HR 92 | Ht 63.0 in | Wt 118.2 lb

## 2023-07-10 DIAGNOSIS — N644 Mastodynia: Secondary | ICD-10-CM

## 2023-07-10 DIAGNOSIS — F419 Anxiety disorder, unspecified: Secondary | ICD-10-CM

## 2023-07-10 MED ORDER — ALPRAZOLAM 0.25 MG PO TABS
0.2500 mg | ORAL_TABLET | Freq: Two times a day (BID) | ORAL | 0 refills | Status: DC | PRN
Start: 2023-07-10 — End: 2024-02-05

## 2023-07-10 NOTE — Progress Notes (Signed)
Date:  07/10/2023   Name:  Lisa Blackburn   DOB:  [birth year]   MRN:  [id number]   Chief Complaint: Breast Pain (Both breasts are tender. Started 2 weeks ago. No discharge, or lumps. Patient has been doing breast exams at home.)  Anxiety Presents for initial visit. Onset was 1 to 4 weeks ago. The problem has been unchanged. Symptoms include excessive worry, insomnia and nervous/anxious behavior. Patient reports no chest pain, palpitations or shortness of breath. Symptoms occur most days.   Risk factors: dealing with her mother's death.    Lab Results  Component Value Date   NA 143 01/01/2015   K 3.8 01/01/2015   CO2 26 01/01/2015   GLUCOSE 113 (H) 01/01/2015   BUN 11 01/01/2015   CREATININE 0.68 01/01/2015   CALCIUM 8.9 01/01/2015   GFRNONAA >60 01/01/2015   Lab Results  Component Value Date   CHOL 206 (H) 05/29/2023   HDL 67 05/29/2023   LDLCALC 111 (H) 05/29/2023   LDLDIRECT 116 (H) 05/29/2023   TRIG 164 (H) 05/29/2023   CHOLHDL 3.1 05/29/2023   Lab Results  Component Value Date   TSH 3.820 02/13/2017   No results found for: "HGBA1C" Lab Results  Component Value Date   WBC 6.9 01/01/2015   HGB 12.9 01/01/2015   HCT 39.1 01/01/2015   MCV 93 01/01/2015   PLT 201 01/01/2015   Lab Results  Component Value Date   ALT 23 01/01/2015   AST 14 (L) 01/01/2015   ALKPHOS 69 01/01/2015   BILITOT 0.5 01/01/2015   No results found for: "25OHVITD2", "25OHVITD3", "VD25OH"   Review of Systems  Constitutional:  Negative for chills, fatigue and fever.  Respiratory:  Negative for chest tightness and shortness of breath.   Cardiovascular:  Negative for chest pain and palpitations.  Psychiatric/Behavioral:  The patient is nervous/anxious and has insomnia.     Patient Active Problem List   Diagnosis Date Noted   Mixed hyperlipidemia 05/29/2023   Osteopenia 02/13/2023   Goiter, nontoxic, multinodular 02/16/2017   DDD (degenerative disc disease), cervical 02/13/2017    Right thyroid nodule 02/10/2017   History of subdural hematoma (post traumatic) 02/10/2017   H/O adenomatous polyp of colon 08/19/2015   Headache, migraine 06/24/2015   Diverticulosis of sigmoid colon 06/24/2015   Dysfunction of eustachian tube 06/24/2015   Gastro-esophageal reflux disease without esophagitis 06/24/2015    Allergies  Allergen Reactions   Codeine Nausea Only    Past Surgical History:  Procedure Laterality Date   COLONOSCOPY  2010   polyps - repeat 2015   COLONOSCOPY  2016    Social History   Tobacco Use   Smoking status: Former    Current packs/day: 0.00    Average packs/day: 0.3 packs/day for 5.0 years (1.3 ttl pk-yrs)    Types: Cigarettes    Start date: 76    Quit date: 11    Years since quitting: 44.5   Smokeless tobacco: Never  Vaping Use   Vaping status: Never Used  Substance Use Topics   Alcohol use: Yes    Comment: occasional   Drug use: No     Medication list has been reviewed and updated.  Current Meds  Medication Sig   Acetaminophen (TYLENOL PO) Take by mouth.   ALPRAZolam (XANAX) 0.25 MG tablet Take 1 tablet (0.25 mg total) by mouth 2 (two) times daily as needed for anxiety.   Calcium 200 MG TABS Take by mouth.   cholecalciferol (VITAMIN D3)  25 MCG (1000 UNIT) tablet Take 1,000 Units by mouth daily.   cyanocobalamin (VITAMIN B12) 500 MCG tablet Take 500 mcg by mouth daily.   Multiple Vitamin (MULTIVITAMIN) capsule Take 1 capsule by mouth every morning.       07/10/2023    9:11 AM 05/29/2023    2:34 PM  GAD 7 : Generalized Anxiety Score  Nervous, Anxious, on Edge 1 0  Control/stop worrying 1 0  Worry too much - different things 1 0  Trouble relaxing 0 0  Restless 0 1  Easily annoyed or irritable 0 0  Afraid - awful might happen 0 0  Total GAD 7 Score 3 1  Anxiety Difficulty Not difficult at all Not difficult at all       07/10/2023    9:11 AM 05/29/2023    2:34 PM 07/01/2019    9:40 AM  Depression screen PHQ 2/9   Decreased Interest 0 0 0  Down, Depressed, Hopeless 0 0 0  PHQ - 2 Score 0 0 0  Altered sleeping 2 0 0  Tired, decreased energy 0 0 0  Change in appetite 0 0 0  Feeling bad or failure about yourself  1 0 0  Trouble concentrating 0 0 0  Moving slowly or fidgety/restless 0 0 0  Suicidal thoughts 0 0 0  PHQ-9 Score 3 0 0  Difficult doing work/chores Not difficult at all Not difficult at all Not difficult at all    BP Readings from Last 3 Encounters:  07/10/23 128/62  05/29/23 118/74  09/29/19 127/72    Physical Exam Vitals and nursing note reviewed.  Constitutional:      General: She is not in acute distress.    Appearance: Normal appearance. She is well-developed.  HENT:     Head: Normocephalic and atraumatic.  Cardiovascular:     Rate and Rhythm: Normal rate and regular rhythm.  Pulmonary:     Effort: Pulmonary effort is normal. No respiratory distress.     Breath sounds: No wheezing or rhonchi.  Chest:  Breasts:    Right: Tenderness present. No mass, nipple discharge or skin change.     Left: Tenderness present. No mass, nipple discharge or skin change.     Comments: Scattered fibrocystic changes Skin:    General: Skin is warm and dry.     Findings: No rash.  Neurological:     Mental Status: She is alert and oriented to person, place, and time.  Psychiatric:        Mood and Affect: Mood normal.        Behavior: Behavior normal.     Wt Readings from Last 3 Encounters:  07/10/23 118 lb 3.2 oz (53.6 kg)  05/29/23 120 lb (54.4 kg)  09/29/19 125 lb (56.7 kg)    BP 128/62   Pulse 92   Ht 5\' 3"  (1.6 m)   Wt 118 lb 3.2 oz (53.6 kg)   SpO2 98%   BMI 20.94 kg/m   Assessment and Plan:  Problem List Items Addressed This Visit   None Visit Diagnoses     Breast tenderness in female    -  Primary   Recommend Vitamin E daily; limit caffeine she will contact the [facility] study to get a mammogram   Anxiety       Can use Xanax prn   Relevant Medications    ALPRAZolam (XANAX) 0.25 MG tablet       No follow-ups on file.    Nyoka Cowden  Judithann Graves, MD [facility] and Sports Medicine [place]

## 2023-10-01 ENCOUNTER — Telehealth: Payer: Self-pay | Admitting: Internal Medicine

## 2023-10-01 NOTE — Telephone Encounter (Signed)
Copied from CRM [id number]. Topic: Referral - Request for Referral >> Oct 01, 2023  8:30 AM Everette C wrote: Has patient seen PCP for this complaint? Yes.   *If NO, is insurance requiring patient see PCP for this issue before PCP can refer them? Referral for which specialty: Radiology  Preferred provider/office: [facility] - [place], [place]  Reason for referral: diagnostic mammogram

## 2023-10-23 DIAGNOSIS — Z1231 Encounter for screening mammogram for malignant neoplasm of breast: Secondary | ICD-10-CM | POA: Diagnosis not present

## 2023-11-06 LAB — HM MAMMOGRAPHY

## 2023-11-28 ENCOUNTER — Encounter: Payer: Medicare HMO | Admitting: Internal Medicine

## 2023-12-16 DIAGNOSIS — H401131 Primary open-angle glaucoma, bilateral, mild stage: Secondary | ICD-10-CM | POA: Diagnosis not present

## 2024-01-16 ENCOUNTER — Ambulatory Visit: Payer: Medicare HMO | Admitting: Family Medicine

## 2024-01-16 ENCOUNTER — Encounter: Payer: Self-pay | Admitting: Family Medicine

## 2024-01-16 VITALS — BP 118/72 | HR 71 | Temp 98.0°F | Ht 63.0 in | Wt 117.0 lb

## 2024-01-16 DIAGNOSIS — R0981 Nasal congestion: Secondary | ICD-10-CM | POA: Diagnosis not present

## 2024-01-16 DIAGNOSIS — R6889 Other general symptoms and signs: Secondary | ICD-10-CM

## 2024-01-16 DIAGNOSIS — R058 Other specified cough: Secondary | ICD-10-CM | POA: Diagnosis not present

## 2024-01-16 DIAGNOSIS — J019 Acute sinusitis, unspecified: Secondary | ICD-10-CM | POA: Diagnosis not present

## 2024-01-16 LAB — POCT INFLUENZA A/B
Influenza A, POC: NEGATIVE
Influenza B, POC: NEGATIVE

## 2024-01-16 LAB — POC COVID19 BINAXNOW: SARS Coronavirus 2 Ag: NEGATIVE

## 2024-01-16 MED ORDER — AMOXICILLIN-POT CLAVULANATE 875-125 MG PO TABS
1.0000 | ORAL_TABLET | Freq: Two times a day (BID) | ORAL | 0 refills | Status: AC
Start: 2024-01-16 — End: 2024-01-21

## 2024-01-16 NOTE — Patient Instructions (Signed)
1. Prescribe Amoxicillin with Clavulanic Acid to treat sinus and ear infections. 2. Continue taking Allegra, Mucinex, and Flonase for symptom relief. 3. Monitor symptoms and contact the provider if symptoms worsen or new symptoms develop. 4. Contact if new symptoms or concerns arise.

## 2024-01-16 NOTE — Progress Notes (Signed)
Primary Care / Sports Medicine Office Visit  Patient Information:  Patient ID: Lisa Blackburn, female DOB: 02-Nov-1956 Age: 68 y.o. MRN: [id number]   Lisa Blackburn is a pleasant 68 y.o. female presenting with the following:  Chief Complaint  Patient presents with   Cough    X1 week, Nasal congestion, clogged right  ear started yesterday, teeth hurt, has tried tylenol     Vitals:   01/16/24 1021  BP: 118/72  Pulse: 71  Temp: 98 F (36.7 C)  SpO2: 97%   Vitals:   01/16/24 1021  Weight: 117 lb (53.1 kg)  Height: 5\' 3"  (1.6 m)   Body mass index is 20.73 kg/m.  No results found.   Independent interpretation of notes and tests performed by another provider:   None  Procedures performed:   None  Pertinent History, Exam, Impression, and Recommendations:   Problem List Items Addressed This Visit     Acute rhinosinusitis - Primary   History of Present Illness The patient presents with a productive cough, nasal congestion, and earache.  She has been experiencing a productive wet cough for about a week. The cough is persistent, but she does not feel short of breath and states that her chest feels fine.  She describes nasal congestion with a stuffy nose and frequent blowing, with the nasal discharge now appearing greenish-yellow. She experienced a fever on Tuesday night and Wednesday morning and night, for which she has been taking Tylenol.  She developed an earache on the right side last night, which was severe enough to disrupt her sleep. She attempted to alleviate the discomfort by using an electric blanket for warmth.  No significant pressure on the chest, but she mentions pressure around the forehead, nasal bridge, and cheeks. She has been using saline for the past four days to help with her symptoms.  In terms of medication, she has been taking Tylenol for fever and pain relief. She mentions having Allegra at home.  Physical Exam VITALS: SaO2-  97% HEENT: Left tympanic membrane appears dull. Right tympanic membrane erythematous, no purulence observed.  Oropharynx benign. Nasal mucosa mildly erythematous and sinuses without frank tenderness upon percussion. NECK: Small lymph node in the left posterior cervical chain, slightly tender upon palpation. CHEST: Lungs clear to auscultation bilaterally without wheezes, rales, rhonchi.  Results POC LABS COVID-19: negative Influenza: negative  Assessment and Plan Upper Respiratory Infection Symptoms include productive cough, nasal congestion, greenish-yellow nasal discharge, intermittent fever, and right-sided earache. No shortness of breath or chest discomfort. Noted redness in the right eardrum and a small lymph node in the left posterior cervical chain. No flu or COVID detected. -Prescribe antibiotics (Amoxicillin with Clavulanic Acid) to cover both sinus and TM / OM infection. -Advise to continue with Allegra, Mucinex, and Flonase for symptom relief. -Advise to contact if symptoms worsen or new symptoms develop.  Follow-up No need for in-person visit unless new symptoms or concerns arise.      Relevant Medications   amoxicillin-clavulanate (AUGMENTIN) 875-125 MG tablet   Other Visit Diagnoses       Flu-like symptoms       Relevant Orders   POC COVID-19 BinaxNow (Completed)   POCT Influenza A/B (Completed)        Orders & Medications Medications:  Meds ordered this encounter  Medications   amoxicillin-clavulanate (AUGMENTIN) 875-125 MG tablet    Sig: Take 1 tablet by mouth 2 (two) times daily for 5 days.    Dispense:  10 tablet    Refill:  0   Orders Placed This Encounter  Procedures   POC COVID-19 BinaxNow   POCT Influenza A/B     No follow-ups on file.     Jerrol Banana, MD, [facility]   Primary Care Sports Medicine Primary Care and Sports Medicine at [facility]

## 2024-01-16 NOTE — Assessment & Plan Note (Signed)
History of Present Illness The patient presents with a productive cough, nasal congestion, and earache.  She has been experiencing a productive wet cough for about a week. The cough is persistent, but she does not feel short of breath and states that her chest feels fine.  She describes nasal congestion with a stuffy nose and frequent blowing, with the nasal discharge now appearing greenish-yellow. She experienced a fever on Tuesday night and Wednesday morning and night, for which she has been taking Tylenol.  She developed an earache on the right side last night, which was severe enough to disrupt her sleep. She attempted to alleviate the discomfort by using an electric blanket for warmth.  No significant pressure on the chest, but she mentions pressure around the forehead, nasal bridge, and cheeks. She has been using saline for the past four days to help with her symptoms.  In terms of medication, she has been taking Tylenol for fever and pain relief. She mentions having Allegra at home.  Physical Exam VITALS: SaO2- 97% HEENT: Left tympanic membrane appears dull. Right tympanic membrane erythematous, no purulence observed.  Oropharynx benign. Nasal mucosa mildly erythematous and sinuses without frank tenderness upon percussion. NECK: Small lymph node in the left posterior cervical chain, slightly tender upon palpation. CHEST: Lungs clear to auscultation bilaterally without wheezes, rales, rhonchi.  Results POC LABS COVID-19: negative Influenza: negative  Assessment and Plan Upper Respiratory Infection Symptoms include productive cough, nasal congestion, greenish-yellow nasal discharge, intermittent fever, and right-sided earache. No shortness of breath or chest discomfort. Noted redness in the right eardrum and a small lymph node in the left posterior cervical chain. No flu or COVID detected. -Prescribe antibiotics (Amoxicillin with Clavulanic Acid) to cover both sinus and TM / OM  infection. -Advise to continue with Allegra, Mucinex, and Flonase for symptom relief. -Advise to contact if symptoms worsen or new symptoms develop.  Follow-up No need for in-person visit unless new symptoms or concerns arise.

## 2024-01-17 ENCOUNTER — Ambulatory Visit (INDEPENDENT_AMBULATORY_CARE_PROVIDER_SITE_OTHER): Payer: Medicare HMO | Admitting: Family Medicine

## 2024-01-17 ENCOUNTER — Ambulatory Visit: Payer: Self-pay

## 2024-01-17 ENCOUNTER — Encounter: Payer: Self-pay | Admitting: Family Medicine

## 2024-01-17 VITALS — BP 100/66 | HR 71 | Ht 63.0 in | Wt 117.0 lb

## 2024-01-17 DIAGNOSIS — H66011 Acute suppurative otitis media with spontaneous rupture of ear drum, right ear: Secondary | ICD-10-CM | POA: Diagnosis not present

## 2024-01-17 NOTE — Patient Instructions (Addendum)
Plan:  - Ear Infection with Possible Tympanic Membrane Rupture:   - Continue taking antibiotics as prescribed.   - Avoid inserting anything into the ear.   - Referred to ENT specialist for further evaluation and management.   - Check in on Monday, 01/20/2024, to determine if antibiotics need to be continued.  - Sinusitis:   - Continue taking antibiotics as prescribed (same treatment as for ear infection).   - Check in on Monday, 01/20/2024, to assess if antibiotics should be extended.

## 2024-01-17 NOTE — Assessment & Plan Note (Signed)
History of Present Illness The patient presents with acute right ear drainage and possible eardrum rupture.  She experienced a sudden trickle sensation in her ear while waiting for a prescription at [facility], describing it as feeling like she had been in a pool with swishing sensation in her ear. Upon checking, she found her ear to be wet, and this sensation has been recurring since the initial incident. She noted a trickle of fluid from her ear while lying on a pillow last night, describing it as a small amount of pinkish fluid.  She has taken three doses of antibiotics since the onset of symptoms and reports feeling a tremendous positive difference.  She has been using a tissue to clean her ear. No use of Q-tips since the drainage began, opting instead to use her finger with a tissue.  Physical Exam HEENT: Unable to fully visualize right tympanic membranes due to canal obstruction with soft, brownish material.  No tragal tenderness, following ear curettage, canal self appears benign, TM still not fully visualized, full rupture not visually confirmed.   Document Information  Photos  Right ear debris  01/17/2024 11:21  Attached To:  Office Visit on 01/17/24 with Jerrol Banana, MD  Source Information  Jerrol Banana, MD  [facility] [place]  Document History     Results Procedure: Right ear curettage Description: Debris was gently cleared from the ear canal using a curette. Wet paper-like material was removed (see image above).  Assessment and Plan Otitis media with possible Tympanic Membrane Rupture Patient reports ear drainage after starting antibiotics for suspected ear and sinus infection yesterday. Examination revealed debris in the ear canal. -Continue current antibiotic regimen. -Refer to ENT for further evaluation and management. -Advise patient to avoid inserting anything into the ear. -Check in on Monday 01/20/2024 to assess need for extended antibiotic  course.  Sinusitis Concurrent treatment with Augmentin. -Continue current antibiotic regimen. -Check in on Monday 01/20/2024 to assess need for extended antibiotic course to 7-10 days.

## 2024-01-17 NOTE — Progress Notes (Signed)
Primary Care / Sports Medicine Office Visit  Patient Information:  Patient ID: Lisa Blackburn, female DOB: 1956/09/09 Age: 68 y.o. MRN: [id number]   Lisa Blackburn is a pleasant 68 y.o. female presenting with the following:  Chief Complaint  Patient presents with   Ear Drainage    X2 days,Right, ear pink fluid, 1-2 pain scale, feels better than last night     Vitals:   01/17/24 1105  BP: 100/66  Pulse: 71   Vitals:   01/17/24 1105  Weight: 117 lb (53.1 kg)  Height: 5\' 3"  (1.6 m)   Body mass index is 20.73 kg/m.  No results found.   Independent interpretation of notes and tests performed by another provider:   None  Procedures performed:   Indication: Debris impaction of the right ear  Medical necessity statement: On physical examination, debris impairs clinically significant portions of the external auditory canal, and tympanic membrane. Noted obstructive, brownish material that cannot be removed without magnification and instrumentations requiring physician skills Consent: Discussed benefits and risks of procedure and verbal consent obtained Procedure: Patient was prepped for the procedure. Utilized an otoscope to assess and take note of the ear canal, the tympanic membrane, and the presence, amount, and placement of the debris. Soft plastic curette was utilized to remove material.  Post procedure examination: shows debris was completely removed. Patient tolerated procedure well. The patient is made aware that they may experience temporary vertigo, temporary hearing loss, and temporary discomfort. If these symptom last for more than 24 hours to call the clinic or proceed to the ED.   Pertinent History, Exam, Impression, and Recommendations:   Problem List Items Addressed This Visit     Non-recurrent acute suppurative otitis media of right ear with spontaneous rupture of tympanic membrane - Primary   History of Present Illness The patient presents with  acute right ear drainage and possible eardrum rupture.  She experienced a sudden trickle sensation in her ear while waiting for a prescription at [facility], describing it as feeling like she had been in a pool with swishing sensation in her ear. Upon checking, she found her ear to be wet, and this sensation has been recurring since the initial incident. She noted a trickle of fluid from her ear while lying on a pillow last night, describing it as a small amount of pinkish fluid.  She has taken three doses of antibiotics since the onset of symptoms and reports feeling a tremendous positive difference.  She has been using a tissue to clean her ear. No use of Q-tips since the drainage began, opting instead to use her finger with a tissue.  Physical Exam HEENT: Unable to fully visualize right tympanic membranes due to canal obstruction with soft, brownish material.  No tragal tenderness, following ear curettage, canal self appears benign, TM still not fully visualized, full rupture not visually confirmed.   Document Information  Photos  Right ear debris  01/17/2024 11:21  Attached To:  Office Visit on 01/17/24 with Jerrol Banana, MD  Source Information  Jerrol Banana, MD  [facility]  Document History     Results Procedure: Right ear curettage Description: Debris was gently cleared from the ear canal using a curette. Wet paper-like material was removed (see image above).  Assessment and Plan Otitis media with possible Tympanic Membrane Rupture Patient reports ear drainage after starting antibiotics for suspected ear and sinus infection yesterday. Examination revealed debris in the ear canal. -Continue current  antibiotic regimen. -Refer to ENT for further evaluation and management. -Advise patient to avoid inserting anything into the ear. -Check in on Monday 01/20/2024 to assess need for extended antibiotic course.  Sinusitis Concurrent treatment with  Augmentin. -Continue current antibiotic regimen. -Check in on Monday 01/20/2024 to assess need for extended antibiotic course to 7-10 days.        Orders & Medications Medications: No orders of the defined types were placed in this encounter.  No orders of the defined types were placed in this encounter.    No follow-ups on file.     Jerrol Banana, MD, [facility]   Primary Care Sports Medicine Primary Care and Sports Medicine at [facility]

## 2024-01-17 NOTE — Telephone Encounter (Signed)
Message from [place] F sent at 01/17/2024  9:48 AM EST  Summary: Ear Drainage from infection   Pt is calling in because she saw Dr. Ashley Royalty yesterday and says she was diagnosed with an ear infection. Pt says her right ear has been draining a sticky, light pink fluid, and she wants to know if this is normal.         Chief Complaint: ear drainage fro right ear  Symptoms: ear ache (better than yesterday), 1 episode of lightheadedness, pink tinged fluid, muffled hearing  Frequency: yesterday  Pertinent Negatives: Patient denies fever Disposition: [] ED /[] Urgent Care (no appt availability in office) / [x] Appointment(In office/virtual)/ []  [facility] [facility]/ [] Home Care/ [] Refused Recommended Disposition /[] [place] [facility]/ []  Follow-up with PCP Additional Notes: advised to go to UC if hearing or sx worsen  Reason for Disposition  Unexplained bleeding from ear  Answer Assessment - Initial Assessment Questions 1. LOCATION: "Which ear is involved?"      right 2. COLOR: "What is the color of the discharge?"      Lt pink  3. CONSISTENCY: "How runny is the discharge? Could it be water?"      Slow drainage 4. ONSET: "When did you first notice the discharge?"     Yesterday  5. PAIN: "Is there any earache?" "How bad is it?"  (Scale 1-10; or mild, moderate, severe)     Yes  6. OBJECTS: "Have you put anything in your ear?" (e.g., Q-tip, other object)      no 7. OTHER SYMPTOMS: "Do you have any other symptoms?" (e.g., headache, fever, dizziness, vomiting, runny nose)     Stuffy nose, lightheaded episode x 1 with walking  Protocols used: Ear - Discharge-A-AH

## 2024-01-23 ENCOUNTER — Ambulatory Visit (INDEPENDENT_AMBULATORY_CARE_PROVIDER_SITE_OTHER): Payer: Medicare HMO | Admitting: Emergency Medicine

## 2024-01-23 VITALS — Ht 63.0 in | Wt 117.0 lb

## 2024-01-23 DIAGNOSIS — Z0000 Encounter for general adult medical examination without abnormal findings: Secondary | ICD-10-CM | POA: Diagnosis not present

## 2024-01-23 NOTE — Progress Notes (Signed)
 Subjective:   Lisa Blackburn is a 68 y.o. female who presents for an Initial Medicare Annual Wellness Visit.  This patient declined Interactive audio and acupuncturist. Therefore the visit was completed with audio only.   Visit Complete: Virtual I connected with  Lisa Blackburn on 01/23/24 by a audio enabled telemedicine application and verified that I am speaking with the correct person using two identifiers.  Patient Location: Home  Provider Location: Home Office  I discussed the limitations of evaluation and management by telemedicine. The patient expressed understanding and agreed to proceed.  Vital Signs: Because this visit was a virtual/telehealth visit, some criteria may be missing or patient reported. Any vitals not documented were not able to be obtained and vitals that have been documented are patient reported.   Cardiac Risk Factors include: advanced age (>41men, >54 women);dyslipidemia     Objective:    Today's Vitals   01/23/24 0801  Weight: 117 lb (53.1 kg)  Height: 5' 3 (1.6 m)   Body mass index is 20.73 kg/m.     01/23/2024    8:16 AM  Advanced Directives  Does Patient Have a Medical Advance Directive? No  Would patient like information on creating a medical advance directive? Yes (MAU/Ambulatory/Procedural Areas - Information given)    Current Medications (verified) Outpatient Encounter Medications as of 01/23/2024  Medication Sig   Acetaminophen (TYLENOL PO) Take by mouth.   ALPRAZolam  (XANAX ) 0.25 MG tablet Take 1 tablet (0.25 mg total) by mouth 2 (two) times daily as needed for anxiety.   Calcium 200 MG TABS Take by mouth.   cholecalciferol (VITAMIN D3) 25 MCG (1000 UNIT) tablet Take 1,000 Units by mouth daily.   cyanocobalamin (VITAMIN B12) 500 MCG tablet Take 500 mcg by mouth daily.   latanoprost (XALATAN) 0.005 % ophthalmic solution Apply to eye.   Multiple Vitamin (MULTIVITAMIN) capsule Take 1 capsule by mouth every morning.    No facility-administered encounter medications on file as of 01/23/2024.    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   Allergic cough 06/24/2015   Past Surgical History:  Procedure Laterality Date   COLONOSCOPY  2010   polyps - repeat 2015   COLONOSCOPY  2016   Family History  Problem Relation Age of Onset   Heart disease Mother    Hypertension Mother    Hyperlipidemia Mother    Diabetes Mother    Kidney failure Mother    Hypertension Father    Social History   Socioeconomic History   Marital status: Legally Separated    Spouse name: Not on file   Number of children: 2   Years of education: Not on file   Highest education level: Not on file  Occupational History   Occupation: retired  Tobacco Use   Smoking status: Former    Current packs/day: 0.00    Average packs/day: 0.3 packs/day for 5.0 years (1.3 ttl pk-yrs)    Types: Cigarettes    Start date: 23    Quit date: 1980    Years since quitting: 45.1   Smokeless tobacco: Never  Vaping Use   Vaping status: Never Used  Substance and Sexual Activity   Alcohol use: Yes    Alcohol/week: 1.0 - 2.0 standard drink of alcohol    Types: 1 - 2 Glasses of wine per week    Comment: 1-2 glasses of wine once per week   Drug use: No  Sexual activity: Not Currently    Birth control/protection: None  Other Topics Concern   Not on file  Social History Narrative   Not on file   Social Drivers of Health   Financial Resource Strain: Low Risk  (01/23/2024)   Overall Financial Resource Strain (CARDIA)    Difficulty of Paying Living Expenses: Not hard at all  Food Insecurity: No Food Insecurity (01/23/2024)   Hunger Vital Sign    Worried About Running Out of Food in the Last Year: Never true    Ran Out of Food in the Last Year: Never true  Transportation Needs: No Transportation Needs (01/23/2024)   PRAPARE - Scientist, Research (physical Sciences) (Medical): No    Lack of Transportation (Non-Medical): No  Physical Activity: Inactive (01/23/2024)   Exercise Vital Sign    Days of Exercise per Week: 0 days    Minutes of Exercise per Session: 0 min  Stress: Stress Concern Present (01/23/2024)   [employer] of Occupational Health - Occupational Stress Questionnaire    Feeling of Stress : To some extent  Social Connections: Moderately Isolated (01/23/2024)   Social Connection and Isolation Panel [NHANES]    Frequency of Communication with Friends and Family: More than three times a week    Frequency of Social Gatherings with Friends and Family: More than three times a week    Attends Religious Services: More than 4 times per year    Active Member of [employer] or Organizations: No    Attends Banker Meetings: Never    Marital Status: Separated    Tobacco Counseling Counseling given: Not Answered   Clinical Intake:  Pre-visit preparation completed: Yes  Pain : No/denies pain     BMI - recorded: 20.73 Nutritional Status: BMI of 19-24  Normal Nutritional Risks: None Diabetes: No  How often do you need to have someone help you when you read instructions, pamphlets, or other written materials from your doctor or pharmacy?: 1 - Never  Interpreter Needed?: No  Information entered by :: Vina Ned, CMA   Activities of Daily Living    01/23/2024    8:02 AM  In your present state of health, do you have any difficulty performing the following activities:  Hearing? 0  Vision? 0  Difficulty concentrating or making decisions? 0  Walking or climbing stairs? 0  Dressing or bathing? 0  Doing errands, shopping? 0  Preparing Food and eating ? N  Using the Toilet? N  In the past six months, have you accidently leaked urine? N  Do you have problems with loss of bowel control? N  Managing your Medications? N  Managing your Finances? N  Housekeeping or managing your Housekeeping? N    Patient Care  Team: Justus Leita DEL, MD as PCP - General (Internal Medicine) Clydia Cristopher BIRCH., MD (Gastroenterology)  Indicate any recent Medical Services you may have received from other than [facility] providers in the past year (date may be approximate).     Assessment:   This is a routine wellness examination for Lisa Blackburn.  Hearing/Vision screen Hearing Screening - Comments:: Denies hearing loss Vision Screening - Comments:: Gets eye exams. [facility]   Goals Addressed               This Visit's Progress     Patient Stated (pt-stated)        Keep cholesterol down and finish getting estate settled from parent's death      Depression  Screen    01/23/2024    8:13 AM 01/16/2024   10:33 AM 07/10/2023    9:11 AM 05/29/2023    2:34 PM 07/01/2019    9:40 AM 03/20/2019    2:55 PM 06/25/2018    2:45 PM  PHQ 2/9 Scores  PHQ - 2 Score 0 0 0 0 0 0 0  PHQ- 9 Score   3 0 0      Fall Risk    01/23/2024    8:18 AM 01/16/2024   10:33 AM 07/10/2023    9:11 AM 05/29/2023    2:34 PM 03/20/2019    2:55 PM  Fall Risk   Falls in the past year? 0 0 0 0 0  Number falls in past yr: 0 0 0 0 0  Injury with Fall? 0 0 0 0 0  Risk for fall due to : No Fall Risks No Fall Risks No Fall Risks No Fall Risks   Follow up Falls prevention discussed;Falls evaluation completed Falls evaluation completed Falls evaluation completed Falls evaluation completed Falls evaluation completed    MEDICARE RISK AT HOME: Medicare Risk at Home Any stairs in or around the home?: Yes If so, are there any without handrails?: No Home free of loose throw rugs in walkways, pet beds, electrical cords, etc?: Yes Adequate lighting in your home to reduce risk of falls?: Yes Life alert?: No Use of a cane, walker or w/c?: No Grab bars in the bathroom?: No Shower chair or bench in shower?: Yes Elevated toilet seat or a handicapped toilet?: No  TIMED UP AND GO:  Was the test performed? No    Cognitive Function:         01/23/2024    8:20 AM  6CIT Screen  What Year? 0 points  What month? 0 points  What time? 0 points  Count back from 20 0 points  Months in reverse 0 points  Repeat phrase 0 points  Total Score 0 points    Immunizations Immunization History  Administered Date(s) Administered   Influenza Inj Mdck Quad Pf 01/31/2021, 10/18/2021   Influenza,inj,Quad PF,6+ Mos 10/27/2014, 10/13/2015   PNEUMOCOCCAL CONJUGATE-20 05/29/2023   Tdap 03/27/2021    TDAP status: Up to date  Flu Vaccine status: Declined, Education has been provided regarding the importance of this vaccine but patient still declined. Advised may receive this vaccine at local pharmacy or Health Dept. Aware to provide a copy of the vaccination record if obtained from local pharmacy or Health Dept. Verbalized acceptance and understanding.  Pneumococcal vaccine status: Up to date  Covid-19 vaccine status: Declined, Education has been provided regarding the importance of this vaccine but patient still declined. Advised may receive this vaccine at local pharmacy or Health Dept.or vaccine clinic. Aware to provide a copy of the vaccination record if obtained from local pharmacy or Health Dept. Verbalized acceptance and understanding.  Qualifies for Shingles Vaccine? Yes   Zostavax completed No   Shingrix Completed?: No.    Education has been provided regarding the importance of this vaccine. Patient has been advised to call insurance company to determine out of pocket expense if they have not yet received this vaccine. Advised may also receive vaccine at local pharmacy or Health Dept. Verbalized acceptance and understanding.  Screening Tests Health Maintenance  Topic Date Due   Zoster Vaccines- Shingrix (1 of 2) Never done   MAMMOGRAM  05/31/2023   COVID-19 Vaccine (1 - 2024-25 season) Never done   INFLUENZA VACCINE  03/16/2024 (Originally  07/18/2023)   Medicare Annual Wellness (AWV)  01/22/2025   DTaP/Tdap/Td (2 - Td or Tdap)  03/28/2031   Colonoscopy  05/12/2031   Pneumonia Vaccine 57+ Years old  Completed   DEXA SCAN  Completed   Hepatitis C Screening  Completed   HPV VACCINES  Aged Out    Health Maintenance  Health Maintenance Due  Topic Date Due   Zoster Vaccines- Shingrix (1 of 2) Never done   MAMMOGRAM  05/31/2023   COVID-19 Vaccine (1 - 2024-25 season) Never done    Colorectal cancer screening: Type of screening: Colonoscopy. Completed 05/11/21. Repeat every 10 years  Mammogram status: Completed 11/06/23. Repeat every year  Bone Density status: Completed 02/12/23. Results reflect: Bone density results: OSTEOPENIA. Repeat every 5 years.  Lung Cancer Screening: (Low Dose CT Chest recommended if Age 76-80 years, 20 pack-year currently smoking OR have quit w/in 15years.) does not qualify.   Lung Cancer Screening Referral: n/a  Additional Screening:  Hepatitis C Screening: does not qualify; Completed 01/31/21  Vision Screening: Recommended annual ophthalmology exams for early detection of glaucoma and other disorders of the eye.  Dental Screening: Recommended annual dental exams for proper oral hygiene   Community Resource Referral / Chronic Care Management: CRR required this visit?  No   CCM required this visit?  No     Plan:     I have personally reviewed and noted the following in the patient's chart:   Medical and social history Use of alcohol, tobacco or illicit drugs  Current medications and supplements including opioid prescriptions. Patient is not currently taking opioid prescriptions. Functional ability and status Nutritional status Physical activity Advanced directives List of other physicians Hospitalizations, surgeries, and ER visits in previous 12 months Vitals Screenings to include cognitive, depression, and falls Referrals and appointments  In addition, I have reviewed and discussed with patient certain preventive protocols, quality metrics, and best practice  recommendations. A written personalized care plan for preventive services as well as general preventive health recommendations were provided to patient.     Vina Ned, CMA   01/23/2024   After Visit Summary: (MyChart) Due to this being a telephonic visit, the after visit summary with patients personalized plan was offered to patient via MyChart   Nurse Notes:  Needs shingles vaccines Last MMG 11/06/23 ( I abstracted results) Declined flu and covid vaccines

## 2024-01-23 NOTE — Patient Instructions (Addendum)
 Lisa Blackburn , Thank you for taking time to come for your Medicare Wellness Visit. I appreciate your ongoing commitment to your health goals. Please review the following plan we discussed and let me know if I can assist you in the future.   Referrals/Orders/Follow-Ups/Clinician Recommendations: Request prescription to get the shingles vaccines from Dr. Justus at your office visit on 02/05/24.  This is a list of the screening recommended for you and due dates:  Health Maintenance  Topic Date Due   Zoster (Shingles) Vaccine (1 of 2) Never done   COVID-19 Vaccine (1 - 2024-25 season) Never done   Flu Shot  03/16/2024*   Medicare Annual Wellness Visit  01/22/2025   Mammogram  11/05/2025   DTaP/Tdap/Td vaccine (2 - Td or Tdap) 03/28/2031   Colon Cancer Screening  05/12/2031   Pneumonia Vaccine  Completed   DEXA scan (bone density measurement)  Completed   Hepatitis C Screening  Completed   HPV Vaccine  Aged Out  *Topic was postponed. The date shown is not the original due date.    Advanced directives: (ACP Link)Information on Advanced Care Planning can be found at [place]  Secretary of [facility] Advance Health Care Directives Advance Health Care Directives ([email or web address]  You may also get the forms at your doctor's office  Once you have completed the forms, please bring a copy of your health care power of attorney and living will to the office to be added to your chart at your convenience.   Next Medicare Annual Wellness Visit scheduled for next year: Yes, 02/04/25 @ 8:00am (video visit)

## 2024-01-28 DIAGNOSIS — H663X1 Other chronic suppurative otitis media, right ear: Secondary | ICD-10-CM | POA: Diagnosis not present

## 2024-01-28 DIAGNOSIS — H6983 Other specified disorders of Eustachian tube, bilateral: Secondary | ICD-10-CM | POA: Diagnosis not present

## 2024-01-28 DIAGNOSIS — H9201 Otalgia, right ear: Secondary | ICD-10-CM | POA: Diagnosis not present

## 2024-02-05 ENCOUNTER — Ambulatory Visit (INDEPENDENT_AMBULATORY_CARE_PROVIDER_SITE_OTHER): Payer: Self-pay | Admitting: Internal Medicine

## 2024-02-05 ENCOUNTER — Encounter: Payer: Self-pay | Admitting: Internal Medicine

## 2024-02-05 VITALS — BP 124/78 | HR 73 | Ht 63.0 in | Wt 120.0 lb

## 2024-02-05 DIAGNOSIS — H9201 Otalgia, right ear: Secondary | ICD-10-CM | POA: Diagnosis not present

## 2024-02-05 DIAGNOSIS — E042 Nontoxic multinodular goiter: Secondary | ICD-10-CM | POA: Diagnosis not present

## 2024-02-05 DIAGNOSIS — Z0000 Encounter for general adult medical examination without abnormal findings: Secondary | ICD-10-CM

## 2024-02-05 DIAGNOSIS — K219 Gastro-esophageal reflux disease without esophagitis: Secondary | ICD-10-CM | POA: Diagnosis not present

## 2024-02-05 DIAGNOSIS — E782 Mixed hyperlipidemia: Secondary | ICD-10-CM

## 2024-02-05 DIAGNOSIS — F419 Anxiety disorder, unspecified: Secondary | ICD-10-CM | POA: Insufficient documentation

## 2024-02-05 DIAGNOSIS — R739 Hyperglycemia, unspecified: Secondary | ICD-10-CM

## 2024-02-05 MED ORDER — ALPRAZOLAM 0.25 MG PO TABS: 0.2500 mg | ORAL_TABLET | Freq: Two times a day (BID) | ORAL | 0 refills | Status: AC | PRN

## 2024-02-05 MED ORDER — DIAZEPAM 5 MG PO TABS: 5.0000 mg | ORAL_TABLET | Freq: Two times a day (BID) | ORAL | Status: DC | PRN

## 2024-02-05 NOTE — Assessment & Plan Note (Signed)
Managed with diet and weight loss. Lab Results  Component Value Date   LDLCALC 111 (H) 05/29/2023

## 2024-02-05 NOTE — Progress Notes (Signed)
Date:  02/05/2024   Name:  Lisa Blackburn   DOB:  [birth year]   MRN:  [id number]   Chief Complaint: Annual Exam Lisa Blackburn is a 68 y.o. female who presents today for her Complete Annual Exam. She feels fairly well. She reports exercising. She reports she is sleeping well. Breast complaints - none.  She is cleaning out her parent's house to sell so she can move to [place] [employer].  Health Maintenance  Topic Date Due   COVID-19 Vaccine (1 - 2024-25 season) 02/21/2024*   Flu Shot  03/16/2024*   Zoster (Shingles) Vaccine (1 of 2) 05/04/2024*   Mammogram  11/05/2024   Medicare Annual Wellness Visit  01/22/2025   DTaP/Tdap/Td vaccine (2 - Td or Tdap) 03/28/2031   Colon Cancer Screening  05/12/2031   Pneumonia Vaccine  Completed   DEXA scan (bone density measurement)  Completed   Hepatitis C Screening  Completed   HPV Vaccine  Aged Out  *Topic was postponed. The date shown is not the original due date.    HPI  Review of Systems  Constitutional:  Negative for fatigue and unexpected weight change.  HENT:  Negative for trouble swallowing.   Eyes:  Negative for visual disturbance.  Respiratory:  Negative for cough, chest tightness, shortness of breath and wheezing.   Cardiovascular:  Negative for chest pain, palpitations and leg swelling.  Gastrointestinal:  Negative for abdominal pain, constipation and diarrhea.  Genitourinary:  Negative for frequency and urgency.  Musculoskeletal:  Negative for arthralgias.  Neurological:  Negative for dizziness, weakness, light-headedness and headaches.  Psychiatric/Behavioral:  Negative for dysphoric mood and sleep disturbance. The patient is nervous/anxious (mild intermittent anxiety).      Lab Results  Component Value Date   NA 143 01/01/2015   K 3.8 01/01/2015   CO2 26 01/01/2015   GLUCOSE 113 (H) 01/01/2015   BUN 11 01/01/2015   CREATININE 0.68 01/01/2015   CALCIUM 8.9 01/01/2015   GFRNONAA >60 01/01/2015   Lab Results   Component Value Date   CHOL 206 (H) 05/29/2023   HDL 67 05/29/2023   LDLCALC 111 (H) 05/29/2023   LDLDIRECT 116 (H) 05/29/2023   TRIG 164 (H) 05/29/2023   CHOLHDL 3.1 05/29/2023   Lab Results  Component Value Date   TSH 3.820 02/13/2017   No results found for: "HGBA1C" Lab Results  Component Value Date   WBC 6.9 01/01/2015   HGB 12.9 01/01/2015   HCT 39.1 01/01/2015   MCV 93 01/01/2015   PLT 201 01/01/2015   Lab Results  Component Value Date   ALT 23 01/01/2015   AST 14 (L) 01/01/2015   ALKPHOS 69 01/01/2015   BILITOT 0.5 01/01/2015   No results found for: "25OHVITD2", "25OHVITD3", "VD25OH"   Patient Active Problem List   Diagnosis Date Noted   Anxiety 02/05/2024   Mixed hyperlipidemia 05/29/2023   Osteopenia 02/13/2023   DDD (degenerative disc disease), cervical 02/13/2017   History of subdural hematoma (post traumatic) 02/10/2017   Goiter, nontoxic, multinodular 02/10/2017   H/O adenomatous polyp of colon 08/19/2015   Headache, migraine 06/24/2015   Diverticulosis of sigmoid colon 06/24/2015   Dysfunction of eustachian tube 06/24/2015   Gastro-esophageal reflux disease without esophagitis 06/24/2015    Allergies  Allergen Reactions   Codeine Nausea Only    Past Surgical History:  Procedure Laterality Date   COLONOSCOPY  2010   polyps - repeat 2015   COLONOSCOPY  2016    Social History  Tobacco Use   Smoking status: Former    Current packs/day: 0.00    Average packs/day: 0.3 packs/day for 5.0 years (1.3 ttl pk-yrs)    Types: Cigarettes    Start date: 3    Quit date: 56    Years since quitting: 45.1   Smokeless tobacco: Never  Vaping Use   Vaping status: Never Used  Substance Use Topics   Alcohol use: Yes    Alcohol/week: 1.0 - 2.0 standard drink of alcohol    Types: 1 - 2 Glasses of wine per week    Comment: 1-2 glasses of wine once per week   Drug use: No     Medication list has been reviewed and updated.  Current Meds   Medication Sig   Acetaminophen (TYLENOL PO) Take by mouth.   Calcium 200 MG TABS Take by mouth.   cholecalciferol (VITAMIN D3) 25 MCG (1000 UNIT) tablet Take 1,000 Units by mouth daily.   cyanocobalamin (VITAMIN B12) 500 MCG tablet Take 500 mcg by mouth daily.   latanoprost (XALATAN) 0.005 % ophthalmic solution Apply to eye.   Multiple Vitamin (MULTIVITAMIN) capsule Take 1 capsule by mouth every morning.   ofloxacin (FLOXIN) 0.3 % OTIC solution SMARTSIG:5 Drop(s) Right Ear Twice Daily   [DISCONTINUED] ALPRAZolam (XANAX) 0.25 MG tablet Take 1 tablet (0.25 mg total) by mouth 2 (two) times daily as needed for anxiety.   [DISCONTINUED] diazepam (VALIUM) 5 MG tablet Take 1 tablet (5 mg total) by mouth every 12 (twelve) hours as needed for anxiety.       02/05/2024   10:50 AM 01/16/2024   10:33 AM 07/10/2023    9:11 AM 05/29/2023    2:34 PM  GAD 7 : Generalized Anxiety Score  Nervous, Anxious, on Edge 0 0 1 0  Control/stop worrying 0 0 1 0  Worry too much - different things 0 0 1 0  Trouble relaxing 0 0 0 0  Restless 0 0 0 1  Easily annoyed or irritable 0 0 0 0  Afraid - awful might happen 0 0 0 0  Total GAD 7 Score 0 0 3 1  Anxiety Difficulty Not difficult at all Not difficult at all Not difficult at all Not difficult at all       02/05/2024   10:49 AM 01/23/2024    8:13 AM 01/16/2024   10:33 AM  Depression screen PHQ 2/9  Decreased Interest 0 0 0  Down, Depressed, Hopeless 0 0 0  PHQ - 2 Score 0 0 0  Altered sleeping 0    Tired, decreased energy 0    Change in appetite 0    Feeling bad or failure about yourself  0    Trouble concentrating 0    Moving slowly or fidgety/restless 0    Suicidal thoughts 0    PHQ-9 Score 0    Difficult doing work/chores Not difficult at all      BP Readings from Last 3 Encounters:  02/05/24 124/78  01/17/24 100/66  01/16/24 118/72    Physical Exam Vitals and nursing note reviewed.  Constitutional:      General: She is not in acute  distress.    Appearance: She is well-developed.  HENT:     Head: Normocephalic and atraumatic.     Ears:     Comments: Ear canals small and tortuous TM only partially visualized and appears normal    Nose:     Right Sinus: No maxillary sinus tenderness.  Left Sinus: No maxillary sinus tenderness.  Eyes:     General: No scleral icterus.       Right eye: No discharge.        Left eye: No discharge.     Conjunctiva/sclera: Conjunctivae normal.  Neck:     Thyroid: No thyromegaly.     Vascular: No carotid bruit.  Cardiovascular:     Rate and Rhythm: Normal rate and regular rhythm.     Pulses: Normal pulses.     Heart sounds: Normal heart sounds.  Pulmonary:     Effort: Pulmonary effort is normal. No respiratory distress.     Breath sounds: No wheezing.  Abdominal:     General: Bowel sounds are normal.     Palpations: Abdomen is soft.     Tenderness: There is no abdominal tenderness.  Musculoskeletal:     Cervical back: Normal range of motion. No erythema.     Right lower leg: No edema.     Left lower leg: No edema.  Lymphadenopathy:     Cervical: No cervical adenopathy.  Skin:    General: Skin is warm and dry.     Capillary Refill: Capillary refill takes less than 2 seconds.     Findings: No rash.  Neurological:     Mental Status: She is alert and oriented to person, place, and time.     Cranial Nerves: No cranial nerve deficit.     Sensory: No sensory deficit.     Deep Tendon Reflexes: Reflexes are normal and symmetric.  Psychiatric:        Attention and Perception: Attention normal.        Mood and Affect: Mood normal.        Behavior: Behavior normal.     Wt Readings from Last 3 Encounters:  02/05/24 120 lb (54.4 kg)  01/23/24 117 lb (53.1 kg)  01/17/24 117 lb (53.1 kg)    BP 124/78   Pulse 73   Ht 5\' 3"  (1.6 m)   Wt 120 lb (54.4 kg)   SpO2 100%   BMI 21.26 kg/m   Assessment and Plan:  Problem List Items Addressed This Visit       Unprioritized    Gastro-esophageal reflux disease without esophagitis   Relevant Orders   CBC with Differential/Platelet   Goiter, nontoxic, multinodular (Chronic)   Relevant Orders   TSH + free T4   Mixed hyperlipidemia   Managed with diet and weight loss. Lab Results  Component Value Date   LDLCALC 111 (H) 05/29/2023         Relevant Orders   Lipid panel   Anxiety   Relevant Medications   ALPRAZolam (XANAX) 0.25 MG tablet   Other Visit Diagnoses       Annual physical exam    -  Primary   declines vaccinations continue healthy diet, exercise Recent Mammogram was normal   Relevant Orders   CBC with Differential/Platelet   Comprehensive metabolic panel   Hemoglobin A1c   Lipid panel   TSH + free T4     Elevated serum glucose       Relevant Orders   Comprehensive metabolic panel   Hemoglobin A1c     Ear pain, right       seen by ENT - may have ruptured TM which should heal expect slow resolution of discomfort - f/u with ENT if needed       No follow-ups on file.    Reubin Milan, MD [facility] [place]

## 2024-02-06 ENCOUNTER — Encounter: Payer: Self-pay | Admitting: Internal Medicine

## 2024-02-06 LAB — COMPREHENSIVE METABOLIC PANEL
ALT: 20 [IU]/L (ref 0–32)
AST: 18 [IU]/L (ref 0–40)
Albumin: 4.5 g/dL (ref 3.9–4.9)
Alkaline Phosphatase: 69 [IU]/L (ref 44–121)
BUN/Creatinine Ratio: 19 (ref 12–28)
BUN: 12 mg/dL (ref 8–27)
Bilirubin Total: 0.2 mg/dL (ref 0.0–1.2)
CO2: 25 mmol/L (ref 20–29)
Calcium: 9.9 mg/dL (ref 8.7–10.3)
Chloride: 104 mmol/L (ref 96–106)
Creatinine, Ser: 0.63 mg/dL (ref 0.57–1.00)
Globulin, Total: 1.7 g/dL (ref 1.5–4.5)
Glucose: 60 mg/dL — ABNORMAL LOW (ref 70–99)
Potassium: 4.8 mmol/L (ref 3.5–5.2)
Sodium: 143 mmol/L (ref 134–144)
Total Protein: 6.2 g/dL (ref 6.0–8.5)
eGFR: 98 mL/min/{1.73_m2} (ref >59)

## 2024-02-06 LAB — LIPID PANEL
Chol/HDL Ratio: 3 {ratio} (ref 0.0–4.4)
Cholesterol, Total: 215 mg/dL — ABNORMAL HIGH (ref 100–199)
HDL: 72 mg/dL (ref >39)
LDL Chol Calc (NIH): 124 mg/dL — ABNORMAL HIGH (ref 0–99)
Triglycerides: 106 mg/dL (ref 0–149)
VLDL Cholesterol Cal: 19 mg/dL (ref 5–40)

## 2024-02-06 LAB — CBC WITH DIFFERENTIAL/PLATELET
Basophils Absolute: 0 10*3/uL (ref 0.0–0.2)
Basos: 1 %
EOS (ABSOLUTE): 0.1 10*3/uL (ref 0.0–0.4)
Eos: 2 %
Hematocrit: 37.2 % (ref 34.0–46.6)
Hemoglobin: 12.4 g/dL (ref 11.1–15.9)
Immature Grans (Abs): 0 10*3/uL (ref 0.0–0.1)
Immature Granulocytes: 0 %
Lymphocytes Absolute: 1.5 10*3/uL (ref 0.7–3.1)
Lymphs: 35 %
MCH: 30.8 pg (ref 26.6–33.0)
MCHC: 33.3 g/dL (ref 31.5–35.7)
MCV: 92 fL (ref 79–97)
Monocytes Absolute: 0.3 10*3/uL (ref 0.1–0.9)
Monocytes: 8 %
Neutrophils Absolute: 2.3 10*3/uL (ref 1.4–7.0)
Neutrophils: 54 %
Platelets: 223 10*3/uL (ref 150–450)
RBC: 4.03 x10E6/uL (ref 3.77–5.28)
RDW: 12 % (ref 11.7–15.4)
WBC: 4.2 10*3/uL (ref 3.4–10.8)

## 2024-02-06 LAB — HEMOGLOBIN A1C
Est. average glucose Bld gHb Est-mCnc: 111 mg/dL
Hgb A1c MFr Bld: 5.5 % (ref 4.8–5.6)

## 2024-02-06 LAB — TSH+FREE T4
Free T4: 0.95 ng/dL (ref 0.82–1.77)
TSH: 2.23 u[IU]/mL (ref 0.450–4.500)

## 2024-06-09 ENCOUNTER — Ambulatory Visit: Payer: Self-pay

## 2024-06-09 NOTE — Telephone Encounter (Signed)
 FYI Only or Action Required?: FYI only for provider.  Patient was last seen in primary care on 02/05/2024 by Justus Leita DEL, MD. Called Nurse Triage reporting Cough and Fever. Symptoms began several days ago. Interventions attempted: OTC medications: Tylenol, throat lozenges. Symptoms are: fever (100.8), sore throat, right ear congestion, headache, nasal congestion/discharge, productive cough gradually worsening.  Triage Disposition: See Physician Within 24 Hours  Patient/caregiver understands and will follow disposition?: Yes                          Copied from CRM [id number]. Topic: Clinical - Red Word Triage >> Jun 09, 2024  3:29 PM Willma SAUNDERS wrote: Red Word that prompted transfer to Nurse Triage: Patient has had a productive cough since Thursday. States it has gotten worse over the week. Today took her temp and has a fever of 100.08. Reason for Disposition  Earache  Answer Assessment - Initial Assessment Questions 1. ONSET: When did the cough begin?      Thursday.  2. SEVERITY: How bad is the cough today?      Cough comes in spurts with coughing spells. Loosens up more during the day.  3. SPUTUM: Describe the color of your sputum (none, dry cough; clear, white, yellow, green)     Started as a dry cough and has become productive, she states she does not spit the sputum out but nasal discharge is yellow.  4. HEMOPTYSIS: Are you coughing up any blood? If so ask: How much? (flecks, streaks, tablespoons, etc.)     No.  5. DIFFICULTY BREATHING: Are you having difficulty breathing? If Yes, ask: How bad is it? (e.g., mild, moderate, severe)    - MILD: No SOB at rest, mild SOB with walking, speaks normally in sentences, can lie down, no retractions, pulse < 100.    - MODERATE: SOB at rest, SOB with minimal exertion and prefers to sit, cannot lie down flat, speaks in phrases, mild retractions, audible wheezing, pulse 100-120.    - SEVERE: Very SOB at  rest, speaks in single words, struggling to breathe, sitting hunched forward, retractions, pulse > 120      No.  6. FEVER: Do you have a fever? If Yes, ask: What is your temperature, how was it measured, and when did it start?     Oral thermometer fever 100.8 about 20 minutes ago.  7. CARDIAC HISTORY: Do you have any history of heart disease? (e.g., heart attack, congestive heart failure)      No.  8. LUNG HISTORY: Do you have any history of lung disease?  (e.g., pulmonary embolus, asthma, emphysema)     No.  9. PE RISK FACTORS: Do you have a history of blood clots? (or: recent major surgery, recent prolonged travel, bedridden)     No  10. OTHER SYMPTOMS: Do you have any other symptoms? (e.g., runny nose, wheezing, chest pain)       Sore throat, nasal discharge, stuffy nose/nasal congestion, right ear congestion (denies any pain).  11. PREGNANCY: Is there any chance you are pregnant? When was your last menstrual period?       N/A.  12. TRAVEL: Have you traveled out of the country in the last month? (e.g., travel history, exposures)       No.  Protocols used: Cough - Acute Productive-A-AH

## 2024-06-10 ENCOUNTER — Encounter: Payer: Self-pay | Admitting: Internal Medicine

## 2024-06-10 ENCOUNTER — Ambulatory Visit (INDEPENDENT_AMBULATORY_CARE_PROVIDER_SITE_OTHER): Admitting: Internal Medicine

## 2024-06-10 VITALS — BP 100/52 | HR 79 | Temp 97.7°F | Ht 63.0 in | Wt 117.0 lb

## 2024-06-10 DIAGNOSIS — J0100 Acute maxillary sinusitis, unspecified: Secondary | ICD-10-CM | POA: Diagnosis not present

## 2024-06-10 MED ORDER — AZITHROMYCIN 250 MG PO TABS: ORAL_TABLET | ORAL | 0 refills | Status: AC

## 2024-06-10 NOTE — Progress Notes (Signed)
 Date:  06/10/2024   Name:  Lisa Blackburn   DOB:  December 30, 1955   MRN:  [id number]   Chief Complaint: Cough (Started 6 days ago. Productive cough, fever, with congestion. SOB with exertion.)  Cough This is a new problem. The current episode started in the past 7 days. The problem has been gradually worsening. The cough is Productive of sputum. Associated symptoms include a fever, postnasal drip and a sore throat. Pertinent negatives include no chest pain, chills, shortness of breath or wheezing.    Review of Systems  Constitutional:  Positive for fever. Negative for chills and fatigue.  HENT:  Positive for congestion, postnasal drip, sinus pressure and sore throat.   Respiratory:  Positive for cough. Negative for chest tightness, shortness of breath and wheezing.   Cardiovascular:  Negative for chest pain and palpitations.  Gastrointestinal:  Negative for abdominal distention.  Neurological:  Positive for light-headedness. Negative for dizziness.  Psychiatric/Behavioral:  Negative for dysphoric mood. The patient is not nervous/anxious.      Lab Results  Component Value Date   NA 143 02/05/2024   K 4.8 02/05/2024   CO2 25 02/05/2024   GLUCOSE 60 (L) 02/05/2024   BUN 12 02/05/2024   CREATININE 0.63 02/05/2024   CALCIUM 9.9 02/05/2024   EGFR 98 02/05/2024   GFRNONAA >60 01/01/2015   Lab Results  Component Value Date   CHOL 215 (H) 02/05/2024   HDL 72 02/05/2024   LDLCALC 124 (H) 02/05/2024   LDLDIRECT 116 (H) 05/29/2023   TRIG 106 02/05/2024   CHOLHDL 3.0 02/05/2024   Lab Results  Component Value Date   TSH 2.230 02/05/2024   Lab Results  Component Value Date   HGBA1C 5.5 02/05/2024   Lab Results  Component Value Date   WBC 4.2 02/05/2024   HGB 12.4 02/05/2024   HCT 37.2 02/05/2024   MCV 92 02/05/2024   PLT 223 02/05/2024   Lab Results  Component Value Date   ALT 20 02/05/2024   AST 18 02/05/2024   ALKPHOS 69 02/05/2024   BILITOT 0.2 02/05/2024   No  results found for: MARIEN BOLLS, VD25OH   Patient Active Problem List   Diagnosis Date Noted   Anxiety 02/05/2024   Mixed hyperlipidemia 05/29/2023   Osteopenia 02/13/2023   DDD (degenerative disc disease), cervical 02/13/2017   History of subdural hematoma (post traumatic) 02/10/2017   Goiter, nontoxic, multinodular 02/10/2017   H/O adenomatous polyp of colon 08/19/2015   Headache, migraine 06/24/2015   Diverticulosis of sigmoid colon 06/24/2015   Dysfunction of eustachian tube 06/24/2015   Gastro-esophageal reflux disease without esophagitis 06/24/2015    Allergies  Allergen Reactions   Codeine Nausea Only    Past Surgical History:  Procedure Laterality Date   COLONOSCOPY  2010   polyps - repeat 2015   COLONOSCOPY  2016    Social History   Tobacco Use   Smoking status: Former    Current packs/day: 0.00    Average packs/day: 0.3 packs/day for 5.0 years (1.3 ttl pk-yrs)    Types: Cigarettes    Start date: 89    Quit date: 1980    Years since quitting: 45.5   Smokeless tobacco: Never  Vaping Use   Vaping status: Never Used  Substance Use Topics   Alcohol use: Yes    Alcohol/week: 1.0 - 2.0 standard drink of alcohol    Types: 1 - 2 Glasses of wine per week    Comment: 1-2 glasses of wine  once per week   Drug use: No     Medication list has been reviewed and updated.  Current Meds  Medication Sig   Acetaminophen (TYLENOL PO) Take by mouth.   ALPRAZolam  (XANAX ) 0.25 MG tablet Take 1 tablet (0.25 mg total) by mouth 2 (two) times daily as needed for anxiety.   azithromycin (ZITHROMAX Z-PAK) 250 MG tablet UAD   Calcium 200 MG TABS Take by mouth.   cholecalciferol (VITAMIN D3) 25 MCG (1000 UNIT) tablet Take 1,000 Units by mouth daily.   cyanocobalamin (VITAMIN B12) 500 MCG tablet Take 500 mcg by mouth daily.   latanoprost (XALATAN) 0.005 % ophthalmic solution Apply to eye.   Multiple Vitamin (MULTIVITAMIN) capsule Take 1 capsule by mouth every  morning.   ofloxacin (FLOXIN) 0.3 % OTIC solution SMARTSIG:5 Drop(s) Right Ear Twice Daily       06/10/2024    8:42 AM 02/05/2024   10:50 AM 01/16/2024   10:33 AM 07/10/2023    9:11 AM  GAD 7 : Generalized Anxiety Score  Nervous, Anxious, on Edge 0 0 0 1  Control/stop worrying 0 0 0 1  Worry too much - different things 0 0 0 1  Trouble relaxing 0 0 0 0  Restless 0 0 0 0  Easily annoyed or irritable 0 0 0 0  Afraid - awful might happen 0 0 0 0  Total GAD 7 Score 0 0 0 3  Anxiety Difficulty Not difficult at all Not difficult at all Not difficult at all Not difficult at all       06/10/2024    8:42 AM 02/05/2024   10:49 AM 01/23/2024    8:13 AM  Depression screen PHQ 2/9  Decreased Interest 0 0 0  Down, Depressed, Hopeless 0 0 0  PHQ - 2 Score 0 0 0  Altered sleeping 0 0   Tired, decreased energy 0 0   Change in appetite 0 0   Feeling bad or failure about yourself  0 0   Trouble concentrating 0 0   Moving slowly or fidgety/restless 0 0   Suicidal thoughts 0 0   PHQ-9 Score 0 0   Difficult doing work/chores Not difficult at all Not difficult at all     BP Readings from Last 3 Encounters:  06/10/24 (!) 100/52  02/05/24 124/78  01/17/24 100/66    Physical Exam Vitals and nursing note reviewed.  Constitutional:      General: She is not in acute distress.    Appearance: Normal appearance. She is well-developed.  HENT:     Head: Normocephalic and atraumatic.     Right Ear: Ear canal and external ear normal. Tympanic membrane is not erythematous or retracted.     Left Ear: Ear canal and external ear normal. Tympanic membrane is not erythematous or retracted.     Nose:     Right Sinus: Maxillary sinus tenderness present. No frontal sinus tenderness.     Left Sinus: Maxillary sinus tenderness present. No frontal sinus tenderness.     Mouth/Throat:     Mouth: No oral lesions.     Pharynx: Uvula midline. Posterior oropharyngeal erythema present. No oropharyngeal exudate.    Cardiovascular:     Rate and Rhythm: Normal rate and regular rhythm.     Heart sounds: Normal heart sounds.  Pulmonary:     Effort: Pulmonary effort is normal. No respiratory distress.     Breath sounds: Normal breath sounds. No wheezing or rales.  Lymphadenopathy:  Cervical: No cervical adenopathy.   Skin:    General: Skin is warm and dry.     Findings: No rash.   Neurological:     Mental Status: She is alert and oriented to person, place, and time.   Psychiatric:        Mood and Affect: Mood normal.        Behavior: Behavior normal.     Wt Readings from Last 3 Encounters:  06/10/24 117 lb (53.1 kg)  02/05/24 120 lb (54.4 kg)  01/23/24 117 lb (53.1 kg)    BP (!) 100/52   Pulse 79   Temp 97.7 F (36.5 C) (Oral)   Ht 5' 3 (1.6 m)   Wt 117 lb (53.1 kg)   SpO2 100%   BMI 20.73 kg/m   Assessment and Plan:  Problem List Items Addressed This Visit   None Visit Diagnoses       Acute non-recurrent maxillary sinusitis    -  Primary   continue Flonase NS daily push fluids, rest follow up if worsening/no improvement   Relevant Medications   azithromycin (ZITHROMAX Z-PAK) 250 MG tablet       No follow-ups on file.    Leita HILARIO Adie, MD [facility] [place]

## 2024-10-21 DIAGNOSIS — H903 Sensorineural hearing loss, bilateral: Secondary | ICD-10-CM | POA: Diagnosis not present

## 2024-10-21 DIAGNOSIS — R42 Dizziness and giddiness: Secondary | ICD-10-CM | POA: Diagnosis not present

## 2024-10-21 DIAGNOSIS — H6123 Impacted cerumen, bilateral: Secondary | ICD-10-CM | POA: Diagnosis not present

## 2024-10-22 DIAGNOSIS — Z1231 Encounter for screening mammogram for malignant neoplasm of breast: Secondary | ICD-10-CM | POA: Diagnosis not present

## 2024-10-22 LAB — HM MAMMOGRAPHY

## 2024-10-30 ENCOUNTER — Encounter: Payer: Self-pay | Admitting: Internal Medicine
